# Patient Record
Sex: Female | Born: 1975 | Race: White | Hispanic: No | Marital: Married | Smoking: Former smoker
Health system: Southern US, Community
[De-identification: ages and names within clinical notes are randomized; demographics above are authoritative.]

## PROBLEM LIST (undated history)

## (undated) DIAGNOSIS — C801 Malignant (primary) neoplasm, unspecified: Secondary | ICD-10-CM

## (undated) HISTORY — DX: Malignant (primary) neoplasm, unspecified: C80.1

---

## 1999-04-22 ENCOUNTER — Other Ambulatory Visit: Admission: RE | Admit: 1999-04-22 | Discharge: 1999-04-22 | Payer: Self-pay | Admitting: *Deleted

## 2000-05-18 ENCOUNTER — Other Ambulatory Visit: Admission: RE | Admit: 2000-05-18 | Discharge: 2000-05-18 | Payer: Self-pay | Admitting: Family Medicine

## 2001-06-07 ENCOUNTER — Other Ambulatory Visit: Admission: RE | Admit: 2001-06-07 | Discharge: 2001-06-07 | Payer: Self-pay | Admitting: *Deleted

## 2002-07-27 ENCOUNTER — Other Ambulatory Visit: Admission: RE | Admit: 2002-07-27 | Discharge: 2002-07-27 | Payer: Self-pay | Admitting: Family Medicine

## 2003-10-18 ENCOUNTER — Other Ambulatory Visit: Admission: RE | Admit: 2003-10-18 | Discharge: 2003-10-18 | Payer: Self-pay | Admitting: Family Medicine

## 2004-12-11 ENCOUNTER — Other Ambulatory Visit: Admission: RE | Admit: 2004-12-11 | Discharge: 2004-12-11 | Payer: Self-pay | Admitting: Family Medicine

## 2005-06-18 ENCOUNTER — Emergency Department (HOSPITAL_COMMUNITY): Admission: EM | Admit: 2005-06-18 | Discharge: 2005-06-18 | Payer: Self-pay | Admitting: Emergency Medicine

## 2005-07-08 ENCOUNTER — Ambulatory Visit (HOSPITAL_COMMUNITY): Admission: RE | Admit: 2005-07-08 | Discharge: 2005-07-08 | Payer: Self-pay | Admitting: Family Medicine

## 2005-09-23 ENCOUNTER — Inpatient Hospital Stay (HOSPITAL_COMMUNITY): Admission: AD | Admit: 2005-09-23 | Discharge: 2005-09-24 | Payer: Self-pay | Admitting: *Deleted

## 2005-10-06 ENCOUNTER — Ambulatory Visit (HOSPITAL_COMMUNITY): Admission: RE | Admit: 2005-10-06 | Discharge: 2005-10-06 | Payer: Self-pay | Admitting: *Deleted

## 2005-10-06 ENCOUNTER — Encounter (INDEPENDENT_AMBULATORY_CARE_PROVIDER_SITE_OTHER): Payer: Self-pay | Admitting: *Deleted

## 2005-10-07 DIAGNOSIS — C801 Malignant (primary) neoplasm, unspecified: Secondary | ICD-10-CM

## 2005-10-07 HISTORY — PX: DIAGNOSTIC LAPAROSCOPY: SUR761

## 2005-10-07 HISTORY — DX: Malignant (primary) neoplasm, unspecified: C80.1

## 2005-10-13 ENCOUNTER — Ambulatory Visit (HOSPITAL_COMMUNITY): Admission: RE | Admit: 2005-10-13 | Discharge: 2005-10-13 | Payer: Self-pay | Admitting: *Deleted

## 2005-10-29 ENCOUNTER — Ambulatory Visit: Payer: Self-pay | Admitting: Oncology

## 2005-11-03 ENCOUNTER — Encounter (INDEPENDENT_AMBULATORY_CARE_PROVIDER_SITE_OTHER): Payer: Self-pay | Admitting: *Deleted

## 2005-11-03 ENCOUNTER — Ambulatory Visit (HOSPITAL_COMMUNITY): Admission: RE | Admit: 2005-11-03 | Discharge: 2005-11-04 | Payer: Self-pay | Admitting: *Deleted

## 2005-11-03 HISTORY — PX: ABDOMINAL HYSTERECTOMY: SHX81

## 2005-11-10 ENCOUNTER — Ambulatory Visit: Admission: RE | Admit: 2005-11-10 | Discharge: 2005-11-10 | Payer: Self-pay | Admitting: Gynecologic Oncology

## 2005-11-23 ENCOUNTER — Ambulatory Visit (HOSPITAL_COMMUNITY): Admission: RE | Admit: 2005-11-23 | Discharge: 2005-11-23 | Payer: Self-pay | Admitting: Oncology

## 2005-12-16 ENCOUNTER — Ambulatory Visit: Payer: Self-pay | Admitting: Oncology

## 2005-12-24 LAB — CBC WITH DIFFERENTIAL/PLATELET
Basophils Absolute: 0 10*3/uL (ref 0.0–0.1)
Eosinophils Absolute: 0.1 10*3/uL (ref 0.0–0.5)
HCT: 35.1 % (ref 34.8–46.6)
HGB: 12.1 g/dL (ref 11.6–15.9)
LYMPH%: 44.3 % (ref 14.0–48.0)
MCHC: 34.5 g/dL (ref 32.0–36.0)
MONO#: 0.2 10*3/uL (ref 0.1–0.9)
NEUT#: 2.2 10*3/uL (ref 1.5–6.5)
NEUT%: 48.6 % (ref 39.6–76.8)
Platelets: 132 10*3/uL — ABNORMAL LOW (ref 145–400)
WBC: 4.5 10*3/uL (ref 3.9–10.0)

## 2006-01-05 ENCOUNTER — Ambulatory Visit: Admission: RE | Admit: 2006-01-05 | Discharge: 2006-01-05 | Payer: Self-pay | Admitting: Gynecologic Oncology

## 2006-01-07 LAB — CBC WITH DIFFERENTIAL/PLATELET
BASO%: 1.1 % (ref 0.0–2.0)
Basophils Absolute: 0.1 10*3/uL (ref 0.0–0.1)
EOS%: 2 % (ref 0.0–7.0)
HCT: 37.4 % (ref 34.8–46.6)
LYMPH%: 41.7 % (ref 14.0–48.0)
MCH: 30.4 pg (ref 26.0–34.0)
MCHC: 35.6 g/dL (ref 32.0–36.0)
MCV: 85.2 fL (ref 81.0–101.0)
NEUT%: 46.1 % (ref 39.6–76.8)
Platelets: 159 10*3/uL (ref 145–400)

## 2006-01-14 LAB — CBC WITH DIFFERENTIAL/PLATELET
BASO%: 0.6 % (ref 0.0–2.0)
EOS%: 1.8 % (ref 0.0–7.0)
MCH: 29.7 pg (ref 26.0–34.0)
MCHC: 34.6 g/dL (ref 32.0–36.0)
MCV: 85.9 fL (ref 81.0–101.0)
MONO%: 4.4 % (ref 0.0–13.0)
NEUT%: 50.3 % (ref 39.6–76.8)
RDW: 14.9 % — ABNORMAL HIGH (ref 11.3–14.5)
lymph#: 1.4 10*3/uL (ref 0.9–3.3)

## 2006-01-27 LAB — CBC WITH DIFFERENTIAL/PLATELET
BASO%: 0.7 % (ref 0.0–2.0)
LYMPH%: 47 % (ref 14.0–48.0)
MCHC: 34.8 g/dL (ref 32.0–36.0)
MCV: 87.4 fL (ref 81.0–101.0)
MONO%: 10.7 % (ref 0.0–13.0)
Platelets: 171 10*3/uL (ref 145–400)
RBC: 4.27 10*6/uL (ref 3.70–5.32)

## 2006-02-01 ENCOUNTER — Ambulatory Visit: Payer: Self-pay | Admitting: Oncology

## 2006-02-04 LAB — CBC WITH DIFFERENTIAL/PLATELET
BASO%: 0.4 % (ref 0.0–2.0)
EOS%: 1.2 % (ref 0.0–7.0)
LYMPH%: 53.6 % — ABNORMAL HIGH (ref 14.0–48.0)
MCH: 30.8 pg (ref 26.0–34.0)
MCHC: 35 g/dL (ref 32.0–36.0)
MONO#: 0.2 10*3/uL (ref 0.1–0.9)
Platelets: 112 10*3/uL — ABNORMAL LOW (ref 145–400)
RBC: 3.82 10*6/uL (ref 3.70–5.32)
WBC: 3.1 10*3/uL — ABNORMAL LOW (ref 3.9–10.0)
lymph#: 1.6 10*3/uL (ref 0.9–3.3)

## 2006-02-17 LAB — CBC WITH DIFFERENTIAL/PLATELET
Eosinophils Absolute: 0 10*3/uL (ref 0.0–0.5)
LYMPH%: 42.7 % (ref 14.0–48.0)
MONO#: 0.4 10*3/uL (ref 0.1–0.9)
NEUT#: 1.6 10*3/uL (ref 1.5–6.5)
Platelets: 168 10*3/uL (ref 145–400)
RBC: 3.9 10*6/uL (ref 3.70–5.32)
WBC: 3.6 10*3/uL — ABNORMAL LOW (ref 3.9–10.0)

## 2006-02-25 LAB — CBC WITH DIFFERENTIAL/PLATELET
Basophils Absolute: 0 10*3/uL (ref 0.0–0.1)
EOS%: 1.8 % (ref 0.0–7.0)
Eosinophils Absolute: 0.1 10*3/uL (ref 0.0–0.5)
HGB: 11.6 g/dL (ref 11.6–15.9)
MCH: 31.4 pg (ref 26.0–34.0)
NEUT#: 1.3 10*3/uL — ABNORMAL LOW (ref 1.5–6.5)
RDW: 14.6 % — ABNORMAL HIGH (ref 11.3–14.5)
WBC: 2.9 10*3/uL — ABNORMAL LOW (ref 3.9–10.0)
lymph#: 1.3 10*3/uL (ref 0.9–3.3)

## 2006-03-09 LAB — CBC WITH DIFFERENTIAL/PLATELET
Basophils Absolute: 0 10*3/uL (ref 0.0–0.1)
Eosinophils Absolute: 0 10*3/uL (ref 0.0–0.5)
HGB: 12.4 g/dL (ref 11.6–15.9)
MCV: 90.1 fL (ref 81.0–101.0)
MONO%: 11.9 % (ref 0.0–13.0)
NEUT#: 1.2 10*3/uL — ABNORMAL LOW (ref 1.5–6.5)
RDW: 15.3 % — ABNORMAL HIGH (ref 11.3–14.5)

## 2006-03-15 ENCOUNTER — Ambulatory Visit: Payer: Self-pay | Admitting: Oncology

## 2006-03-18 LAB — CBC WITH DIFFERENTIAL/PLATELET
Basophils Absolute: 0 10*3/uL (ref 0.0–0.1)
Eosinophils Absolute: 0.1 10*3/uL (ref 0.0–0.5)
HCT: 33.5 % — ABNORMAL LOW (ref 34.8–46.6)
HGB: 11.7 g/dL (ref 11.6–15.9)
LYMPH%: 47.9 % (ref 14.0–48.0)
MCV: 90.2 fL (ref 81.0–101.0)
MONO%: 6.2 % (ref 0.0–13.0)
NEUT#: 1.4 10*3/uL — ABNORMAL LOW (ref 1.5–6.5)
NEUT%: 43.7 % (ref 39.6–76.8)
Platelets: 108 10*3/uL — ABNORMAL LOW (ref 145–400)
RBC: 3.71 10*6/uL (ref 3.70–5.32)

## 2006-04-13 ENCOUNTER — Ambulatory Visit (HOSPITAL_COMMUNITY): Admission: RE | Admit: 2006-04-13 | Discharge: 2006-04-13 | Payer: Self-pay | Admitting: Oncology

## 2006-04-20 ENCOUNTER — Ambulatory Visit: Admission: RE | Admit: 2006-04-20 | Discharge: 2006-04-20 | Payer: Self-pay | Admitting: Gynecologic Oncology

## 2006-05-14 ENCOUNTER — Ambulatory Visit (HOSPITAL_COMMUNITY): Admission: RE | Admit: 2006-05-14 | Discharge: 2006-05-14 | Payer: Self-pay | Admitting: Oncology

## 2006-07-14 ENCOUNTER — Ambulatory Visit: Payer: Self-pay | Admitting: Oncology

## 2006-07-23 ENCOUNTER — Ambulatory Visit (HOSPITAL_COMMUNITY): Admission: RE | Admit: 2006-07-23 | Discharge: 2006-07-23 | Payer: Self-pay | Admitting: Oncology

## 2006-07-27 ENCOUNTER — Ambulatory Visit: Admission: RE | Admit: 2006-07-27 | Discharge: 2006-07-27 | Payer: Self-pay | Admitting: Gynecologic Oncology

## 2006-11-04 ENCOUNTER — Ambulatory Visit: Payer: Self-pay | Admitting: Oncology

## 2006-11-09 LAB — COMPREHENSIVE METABOLIC PANEL
Albumin: 4.6 g/dL (ref 3.5–5.2)
Alkaline Phosphatase: 78 U/L (ref 39–117)
BUN: 15 mg/dL (ref 6–23)
CO2: 29 mEq/L (ref 19–32)
Glucose, Bld: 97 mg/dL (ref 70–99)
Sodium: 142 mEq/L (ref 135–145)
Total Bilirubin: 0.3 mg/dL (ref 0.3–1.2)
Total Protein: 7.2 g/dL (ref 6.0–8.3)

## 2006-11-09 LAB — CBC WITH DIFFERENTIAL/PLATELET
BASO%: 0.4 % (ref 0.0–2.0)
Basophils Absolute: 0 10*3/uL (ref 0.0–0.1)
EOS%: 1.5 % (ref 0.0–7.0)
HGB: 13.8 g/dL (ref 11.6–15.9)
MCH: 29.3 pg (ref 26.0–34.0)
MCV: 84.4 fL (ref 81.0–101.0)
MONO%: 6.8 % (ref 0.0–13.0)
RBC: 4.73 10*6/uL (ref 3.70–5.32)
RDW: 12.7 % (ref 11.3–14.5)
lymph#: 2.1 10*3/uL (ref 0.9–3.3)

## 2006-11-09 LAB — CA 125: CA 125: 4.8 U/mL (ref 0.0–30.2)

## 2007-01-31 ENCOUNTER — Emergency Department (HOSPITAL_COMMUNITY): Admission: EM | Admit: 2007-01-31 | Discharge: 2007-01-31 | Payer: Self-pay | Admitting: Emergency Medicine

## 2007-03-03 ENCOUNTER — Ambulatory Visit: Payer: Self-pay | Admitting: Oncology

## 2007-03-07 LAB — CA 125: CA 125: 4.8 U/mL (ref 0.0–30.2)

## 2007-03-08 ENCOUNTER — Ambulatory Visit: Admission: RE | Admit: 2007-03-08 | Discharge: 2007-03-08 | Payer: Self-pay | Admitting: Gynecologic Oncology

## 2007-05-06 ENCOUNTER — Ambulatory Visit: Payer: Self-pay | Admitting: Oncology

## 2007-05-10 LAB — CBC WITH DIFFERENTIAL/PLATELET
BASO%: 0.4 % (ref 0.0–2.0)
EOS%: 1.9 % (ref 0.0–7.0)
HCT: 37.7 % (ref 34.8–46.6)
HGB: 13 g/dL (ref 11.6–15.9)
MCH: 29.1 pg (ref 26.0–34.0)
MCHC: 34.6 g/dL (ref 32.0–36.0)
MONO#: 0.4 10*3/uL (ref 0.1–0.9)
RDW: 12.9 % (ref 11.3–14.5)
WBC: 5.6 10*3/uL (ref 3.9–10.0)
lymph#: 1.8 10*3/uL (ref 0.9–3.3)

## 2007-05-11 LAB — COMPREHENSIVE METABOLIC PANEL
ALT: 19 U/L (ref 0–35)
AST: 14 U/L (ref 0–37)
Albumin: 4.5 g/dL (ref 3.5–5.2)
CO2: 29 mEq/L (ref 19–32)
Calcium: 9.6 mg/dL (ref 8.4–10.5)
Chloride: 105 mEq/L (ref 96–112)
Potassium: 4.2 mEq/L (ref 3.5–5.3)
Sodium: 143 mEq/L (ref 135–145)
Total Protein: 6.7 g/dL (ref 6.0–8.3)

## 2007-05-11 LAB — CA 125: CA 125: 5.4 U/mL (ref 0.0–30.2)

## 2007-09-06 ENCOUNTER — Ambulatory Visit: Payer: Self-pay | Admitting: Oncology

## 2007-09-13 ENCOUNTER — Ambulatory Visit: Admission: RE | Admit: 2007-09-13 | Discharge: 2007-09-13 | Payer: Self-pay | Admitting: Gynecologic Oncology

## 2007-09-28 ENCOUNTER — Ambulatory Visit (HOSPITAL_COMMUNITY): Admission: RE | Admit: 2007-09-28 | Discharge: 2007-09-28 | Payer: Self-pay | Admitting: Gynecologic Oncology

## 2007-12-08 ENCOUNTER — Ambulatory Visit: Payer: Self-pay | Admitting: Oncology

## 2007-12-08 LAB — CBC WITH DIFFERENTIAL/PLATELET
BASO%: 0.5 % (ref 0.0–2.0)
LYMPH%: 37.3 % (ref 14.0–48.0)
MCHC: 34.7 g/dL (ref 32.0–36.0)
MONO#: 0.4 10*3/uL (ref 0.1–0.9)
Platelets: 208 10*3/uL (ref 145–400)
RBC: 4.41 10*6/uL (ref 3.70–5.32)
WBC: 6.1 10*3/uL (ref 3.9–10.0)

## 2007-12-08 LAB — COMPREHENSIVE METABOLIC PANEL
ALT: 18 U/L (ref 0–35)
Alkaline Phosphatase: 86 U/L (ref 39–117)
CO2: 29 mEq/L (ref 19–32)
Sodium: 142 mEq/L (ref 135–145)
Total Bilirubin: 0.2 mg/dL — ABNORMAL LOW (ref 0.3–1.2)
Total Protein: 6.9 g/dL (ref 6.0–8.3)

## 2007-12-08 LAB — CA 125: CA 125: 2.5 U/mL (ref 0.0–30.2)

## 2008-02-05 ENCOUNTER — Emergency Department (HOSPITAL_COMMUNITY): Admission: EM | Admit: 2008-02-05 | Discharge: 2008-02-05 | Payer: Self-pay | Admitting: Emergency Medicine

## 2008-02-28 ENCOUNTER — Ambulatory Visit: Payer: Self-pay | Admitting: Oncology

## 2008-03-01 LAB — CBC WITH DIFFERENTIAL/PLATELET
Eosinophils Absolute: 0.2 10*3/uL (ref 0.0–0.5)
HGB: 13.3 g/dL (ref 11.6–15.9)
MONO#: 0.5 10*3/uL (ref 0.1–0.9)
NEUT#: 3.7 10*3/uL (ref 1.5–6.5)
RBC: 4.6 10*6/uL (ref 3.70–5.32)
RDW: 12.5 % (ref 11.3–14.5)
WBC: 6.8 10*3/uL (ref 3.9–10.0)

## 2008-03-01 LAB — COMPREHENSIVE METABOLIC PANEL
Albumin: 4.5 g/dL (ref 3.5–5.2)
Alkaline Phosphatase: 84 U/L (ref 39–117)
CO2: 26 mEq/L (ref 19–32)
Calcium: 9.1 mg/dL (ref 8.4–10.5)
Chloride: 106 mEq/L (ref 96–112)
Glucose, Bld: 93 mg/dL (ref 70–99)
Potassium: 3.9 mEq/L (ref 3.5–5.3)
Sodium: 142 mEq/L (ref 135–145)
Total Protein: 6.9 g/dL (ref 6.0–8.3)

## 2008-03-01 LAB — CA 125: CA 125: 4.7 U/mL (ref 0.0–30.2)

## 2008-03-06 ENCOUNTER — Ambulatory Visit: Admission: RE | Admit: 2008-03-06 | Discharge: 2008-03-06 | Payer: Self-pay | Admitting: Gynecologic Oncology

## 2008-05-18 ENCOUNTER — Ambulatory Visit: Payer: Self-pay | Admitting: Oncology

## 2008-06-26 LAB — CBC WITH DIFFERENTIAL/PLATELET
BASO%: 0.3 % (ref 0.0–2.0)
EOS%: 1.5 % (ref 0.0–7.0)
HCT: 39.5 % (ref 34.8–46.6)
LYMPH%: 26.3 % (ref 14.0–48.0)
MCH: 28.6 pg (ref 26.0–34.0)
MCHC: 34 g/dL (ref 32.0–36.0)
NEUT%: 65.6 % (ref 39.6–76.8)
Platelets: 222 10*3/uL (ref 145–400)

## 2008-06-26 LAB — COMPREHENSIVE METABOLIC PANEL
ALT: 17 U/L (ref 0–35)
AST: 13 U/L (ref 0–37)
CO2: 29 mEq/L (ref 19–32)
Creatinine, Ser: 0.71 mg/dL (ref 0.40–1.20)
Total Bilirubin: 0.3 mg/dL (ref 0.3–1.2)

## 2008-06-26 LAB — CA 125: CA 125: 3.6 U/mL (ref 0.0–30.2)

## 2008-07-03 ENCOUNTER — Ambulatory Visit: Payer: Self-pay | Admitting: Oncology

## 2008-10-19 ENCOUNTER — Ambulatory Visit: Payer: Self-pay | Admitting: Oncology

## 2008-10-30 ENCOUNTER — Ambulatory Visit: Admission: RE | Admit: 2008-10-30 | Discharge: 2008-10-30 | Payer: Self-pay | Admitting: Gynecologic Oncology

## 2009-02-28 ENCOUNTER — Ambulatory Visit: Payer: Self-pay | Admitting: Oncology

## 2009-03-04 LAB — COMPREHENSIVE METABOLIC PANEL
ALT: 20 U/L (ref 0–35)
AST: 18 U/L (ref 0–37)
Albumin: 4 g/dL (ref 3.5–5.2)
Calcium: 9 mg/dL (ref 8.4–10.5)
Chloride: 103 mEq/L (ref 96–112)
Potassium: 4 mEq/L (ref 3.5–5.3)

## 2009-03-04 LAB — CBC WITH DIFFERENTIAL/PLATELET
BASO%: 0.3 % (ref 0.0–2.0)
Basophils Absolute: 0 10*3/uL (ref 0.0–0.1)
EOS%: 2.7 % (ref 0.0–7.0)
HGB: 13.8 g/dL (ref 11.6–15.9)
MCH: 29.3 pg (ref 25.1–34.0)
MCHC: 34.9 g/dL (ref 31.5–36.0)
RDW: 13.3 % (ref 11.2–14.5)
lymph#: 2.1 10*3/uL (ref 0.9–3.3)

## 2009-09-06 ENCOUNTER — Ambulatory Visit: Payer: Self-pay | Admitting: Oncology

## 2009-09-11 LAB — VITAMIN D 25 HYDROXY (VIT D DEFICIENCY, FRACTURES): Vit D, 25-Hydroxy: 21 ng/mL — ABNORMAL LOW (ref 30–89)

## 2009-09-11 LAB — CA 125: CA 125: 5.1 U/mL (ref 0.0–30.2)

## 2009-09-18 ENCOUNTER — Other Ambulatory Visit: Admission: RE | Admit: 2009-09-18 | Discharge: 2009-09-18 | Payer: Self-pay | Admitting: Gynecologic Oncology

## 2009-09-18 ENCOUNTER — Ambulatory Visit: Admission: RE | Admit: 2009-09-18 | Discharge: 2009-09-18 | Payer: Self-pay | Admitting: Gynecologic Oncology

## 2009-09-20 ENCOUNTER — Emergency Department (HOSPITAL_COMMUNITY): Admission: EM | Admit: 2009-09-20 | Discharge: 2009-09-20 | Payer: Self-pay | Admitting: Emergency Medicine

## 2010-02-21 ENCOUNTER — Ambulatory Visit: Payer: Self-pay | Admitting: Oncology

## 2010-02-25 LAB — CBC WITH DIFFERENTIAL/PLATELET
BASO%: 0.5 % (ref 0.0–2.0)
Basophils Absolute: 0 10*3/uL (ref 0.0–0.1)
EOS%: 3.4 % (ref 0.0–7.0)
Eosinophils Absolute: 0.2 10*3/uL (ref 0.0–0.5)
HCT: 37.2 % (ref 34.8–46.6)
HGB: 13 g/dL (ref 11.6–15.9)
LYMPH%: 35 % (ref 14.0–49.7)
MCH: 29.3 pg (ref 25.1–34.0)
MCHC: 35 g/dL (ref 31.5–36.0)
MCV: 83.7 fL (ref 79.5–101.0)
MONO#: 0.4 10*3/uL (ref 0.1–0.9)
MONO%: 7.2 % (ref 0.0–14.0)
NEUT#: 3.2 10*3/uL (ref 1.5–6.5)
NEUT%: 53.9 % (ref 38.4–76.8)
Platelets: 208 10*3/uL (ref 145–400)
RBC: 4.44 10*6/uL (ref 3.70–5.45)
RDW: 13.4 % (ref 11.2–14.5)
WBC: 5.9 10*3/uL (ref 3.9–10.3)
lymph#: 2 10*3/uL (ref 0.9–3.3)

## 2010-02-25 LAB — COMPREHENSIVE METABOLIC PANEL
ALT: 15 U/L (ref 0–35)
AST: 13 U/L (ref 0–37)
Albumin: 4.5 g/dL (ref 3.5–5.2)
Alkaline Phosphatase: 80 U/L (ref 39–117)
BUN: 11 mg/dL (ref 6–23)
CO2: 27 mEq/L (ref 19–32)
Calcium: 9.5 mg/dL (ref 8.4–10.5)
Chloride: 105 mEq/L (ref 96–112)
Creatinine, Ser: 0.67 mg/dL (ref 0.40–1.20)
Glucose, Bld: 97 mg/dL (ref 70–99)
Potassium: 4.5 mEq/L (ref 3.5–5.3)
Sodium: 143 mEq/L (ref 135–145)
Total Bilirubin: 0.3 mg/dL (ref 0.3–1.2)
Total Protein: 6.8 g/dL (ref 6.0–8.3)

## 2010-02-25 LAB — CA 125: CA 125: 2.9 U/mL (ref 0.0–30.2)

## 2010-06-11 IMAGING — CT CT ABDOMEN W/ CM
2 of 5 series · 16 of 46 positions shown, 18 images · IV contrast (agent unspecified)
Comparison: CT dated 04/13/2006

CT ABDOMEN

CLINICAL DATA: 33-year-old with right lower back pain.

CT ABDOMEN AND PELVIS WITH CONTRAST
TECHNIQUE: Multidetector CT imaging of the abdomen and pelvis was
performed using the standard protocol following bolus
administration of intravenous contrast.
Contrast: 100 ml 4mnipaque-3BB

[Series 2: abd_pel_with 5.0 b40f · axial · 0.69mm/px · z∈[-496,-76]mm · 13 of 100 slices shown, 15 images]
[im 8/100  soft-tissue]
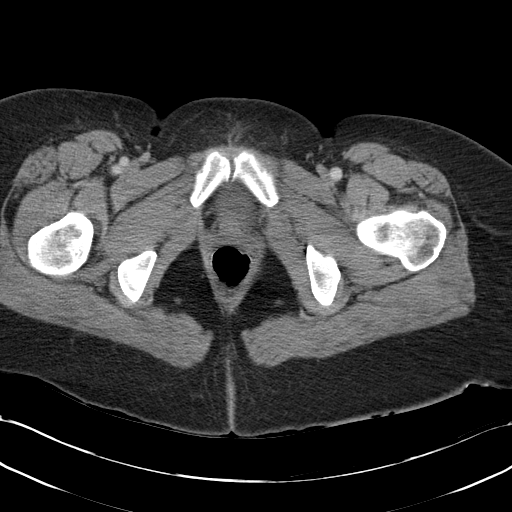
[im 8/100  bone]
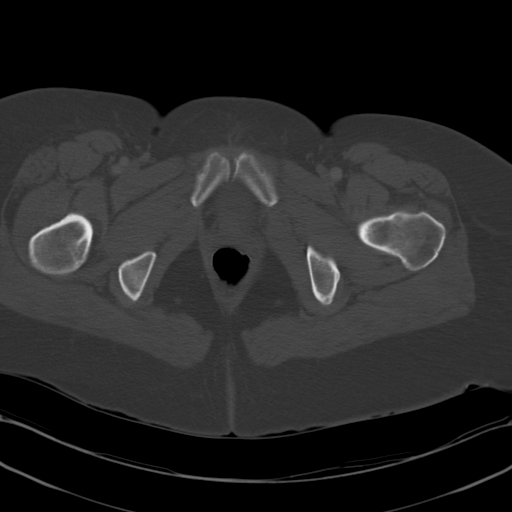
[im 15/100  soft-tissue]
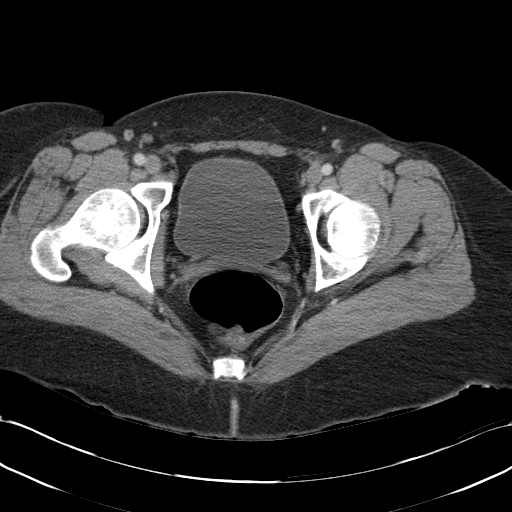
[im 22/100  soft-tissue]
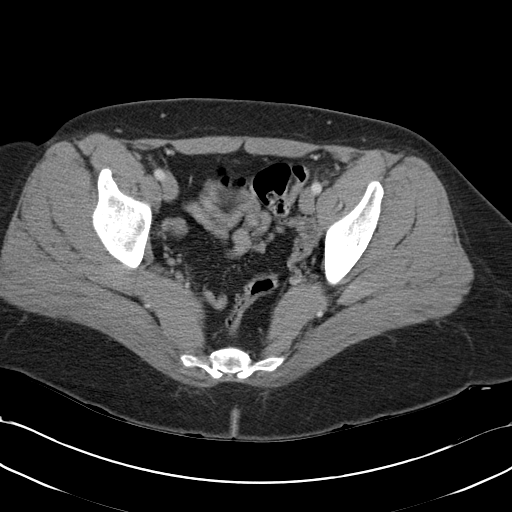
[im 29/100  soft-tissue]
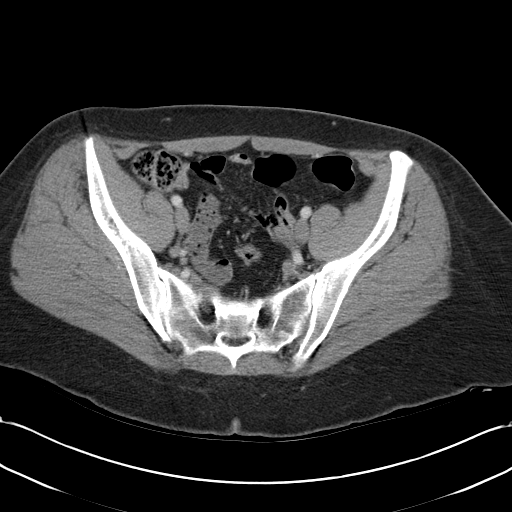
[im 36/100  soft-tissue]
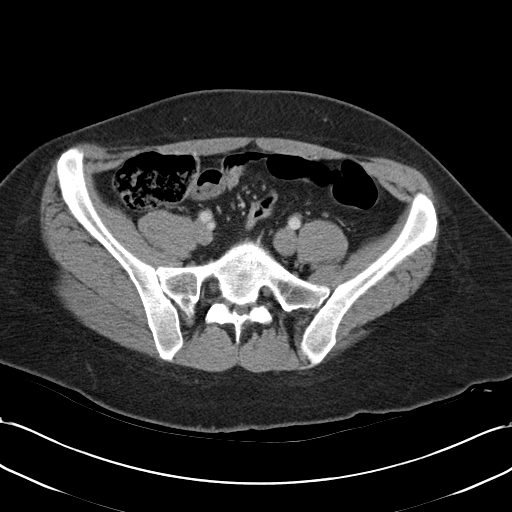
[im 43/100  soft-tissue]
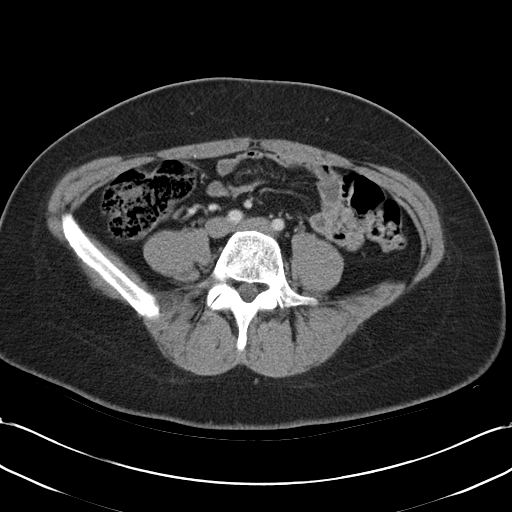
[im 50/100  soft-tissue]
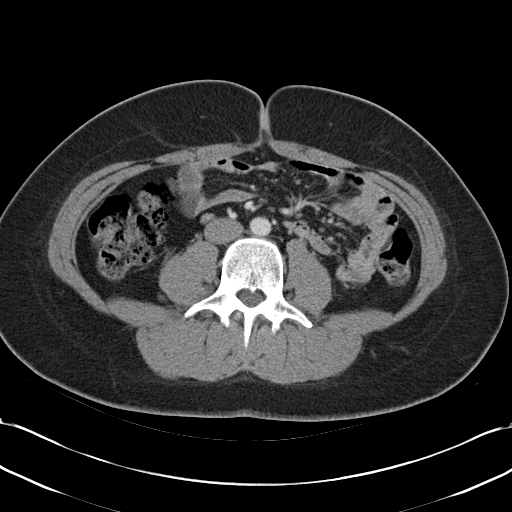
[im 57/100  soft-tissue]
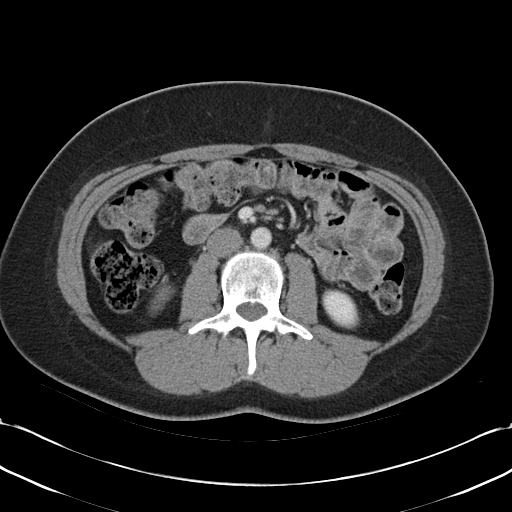
[im 64/100  soft-tissue]
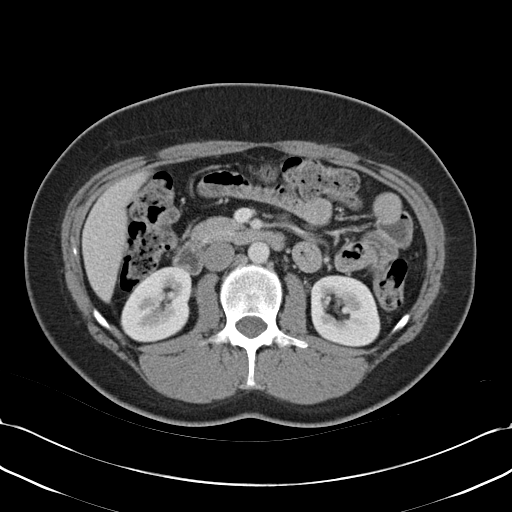
[im 64/100  bone]
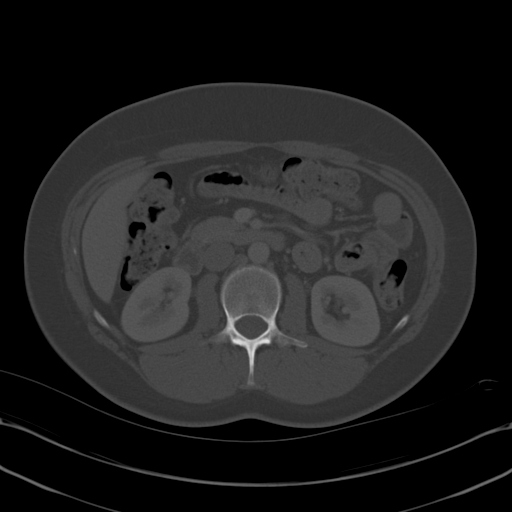
[im 71/100  soft-tissue]
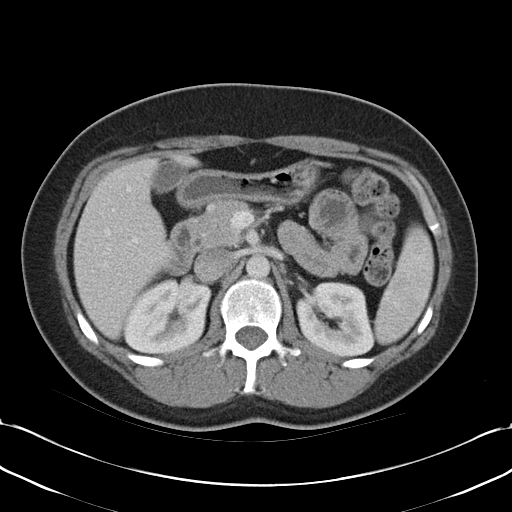
[im 78/100  soft-tissue]
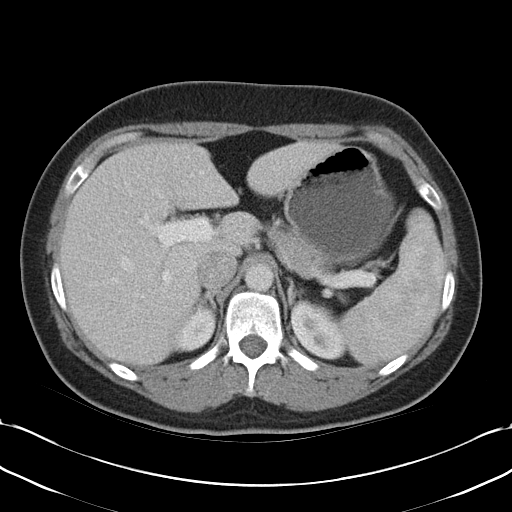
[im 85/100  soft-tissue]
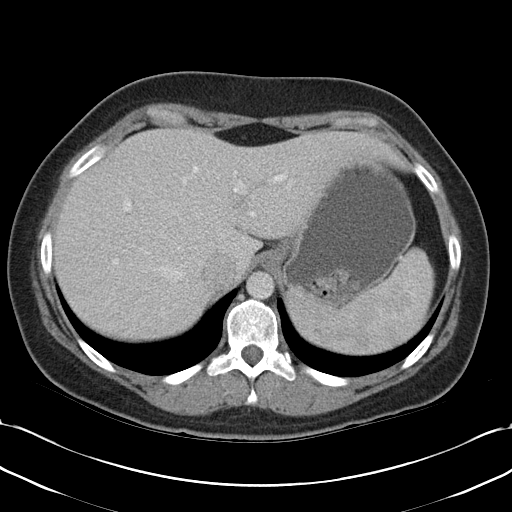
[im 92/100  soft-tissue]
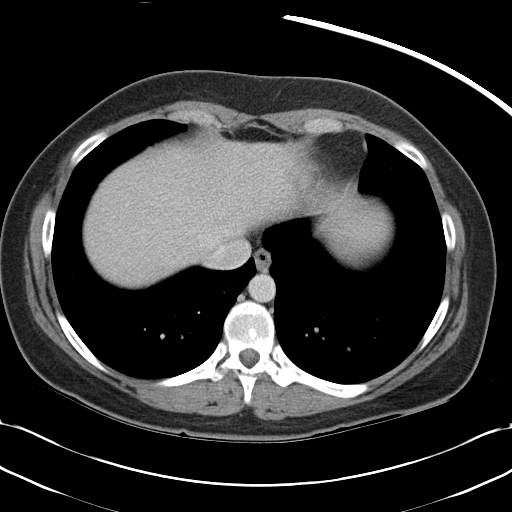

[Series 4: mpr cor post contrast (id) · coronal · 0.63mm/px · 3 of 63 slices shown]
[im 21/63  soft-tissue]
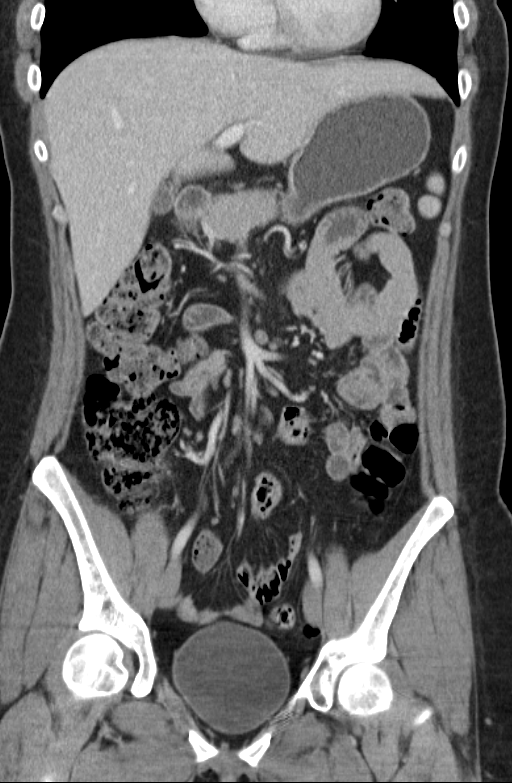
[im 28/63  soft-tissue]
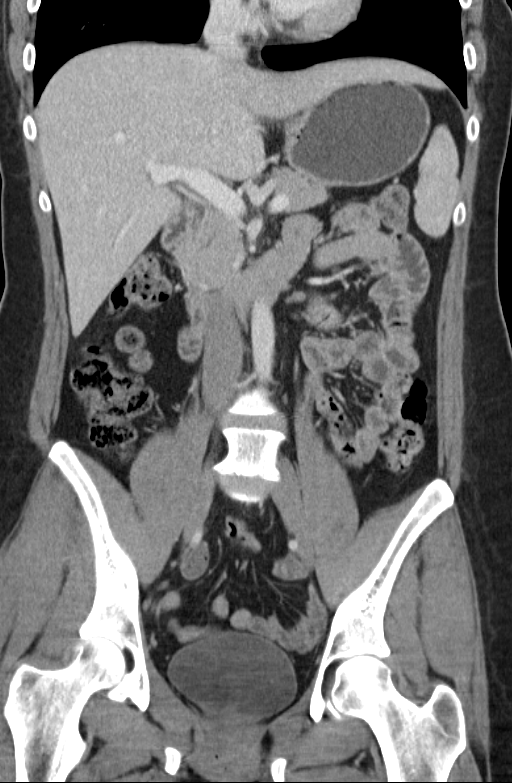
[im 35/63  soft-tissue]
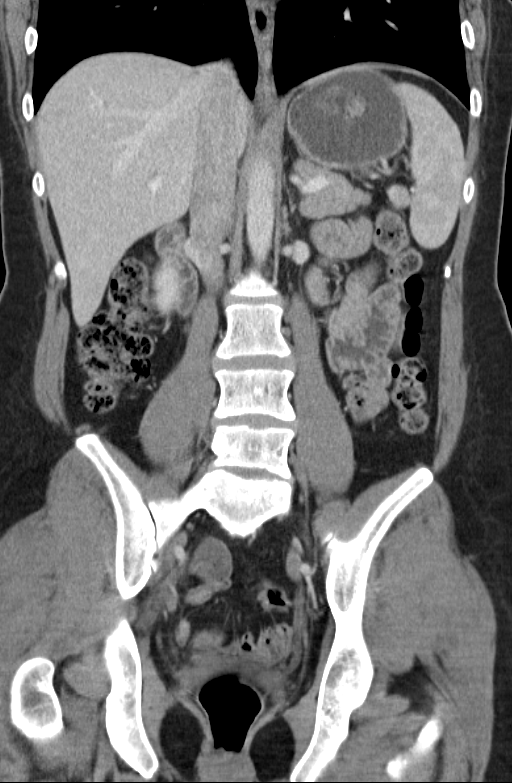

[16 of 46 positions shown; findings below may reference images not displayed]

FINDINGS: The lung bases are clear.  There is no evidence to
suggest free air.  There is a normal appearance of the liver,
portal venous system, gallbladder, spleen, adrenal glands, pancreas
and kidneys.  No evidence for free fluid or lymphadenopathy in the
abdomen.  Appendix is not confidently identified but there are no
inflammatory changes in the right lower quadrant.  Stable area of
sclerosis involving the right iliac bone.  No acute bony
abnormalities.
IMPRESSION: No acute abdominal findings.

CT PELVIS
FINDINGS: The uterus has been removed.  The left adnexa tissue is
not well visualized compared to prior examination.  The cystic
structure along the left hemi pelvis has resolved.   Fluid in the
urinary bladder.  No significant free fluid or lymphadenopathy in
the pelvis.  No acute bony abnormalities.
IMPRESSION: No acute pelvic findings.

## 2010-09-02 ENCOUNTER — Ambulatory Visit: Payer: Self-pay | Admitting: Oncology

## 2010-09-04 LAB — CA 125: CA 125: 2.2 U/mL (ref 0.0–30.2)

## 2010-09-18 ENCOUNTER — Ambulatory Visit
Admission: RE | Admit: 2010-09-18 | Discharge: 2010-09-18 | Payer: Self-pay | Source: Home / Self Care | Attending: Gynecologic Oncology | Admitting: Gynecologic Oncology

## 2010-10-12 ENCOUNTER — Encounter: Payer: Self-pay | Admitting: Emergency Medicine

## 2010-12-22 LAB — URINALYSIS, ROUTINE W REFLEX MICROSCOPIC
Bilirubin Urine: NEGATIVE
Glucose, UA: NEGATIVE mg/dL
Hgb urine dipstick: NEGATIVE
Ketones, ur: NEGATIVE mg/dL
Nitrite: NEGATIVE
Protein, ur: NEGATIVE mg/dL
Specific Gravity, Urine: 1.01 (ref 1.005–1.030)
Urobilinogen, UA: 0.2 mg/dL (ref 0.0–1.0)
pH: 7 (ref 5.0–8.0)

## 2010-12-22 LAB — DIFFERENTIAL
Eosinophils Relative: 2 % (ref 0–5)
Lymphocytes Relative: 36 % (ref 12–46)
Monocytes Absolute: 0.3 10*3/uL (ref 0.1–1.0)
Monocytes Relative: 5 % (ref 3–12)
Neutro Abs: 3.2 10*3/uL (ref 1.7–7.7)

## 2010-12-22 LAB — BASIC METABOLIC PANEL
CO2: 29 mEq/L (ref 19–32)
Calcium: 9.7 mg/dL (ref 8.4–10.5)
GFR calc Af Amer: >60 mL/min (ref >60)
GFR calc non Af Amer: >60 mL/min (ref >60)
Potassium: 4 mEq/L (ref 3.5–5.1)
Sodium: 139 mEq/L (ref 135–145)

## 2010-12-22 LAB — CBC
HCT: 40.9 % (ref 36.0–46.0)
Hemoglobin: 13.9 g/dL (ref 12.0–15.0)
RBC: 4.8 MIL/uL (ref 3.87–5.11)

## 2011-02-03 NOTE — Consult Note (Signed)
NAMESOPHEAP, BASIC                ACCOUNT NO.:  [id number]   MEDICAL RECORD NO.:  [id number]          PATIENT TYPE:  OUT   LOCATION:  GYN                          FACILITY:  [facility]   PHYSICIAN:  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  Pathology was consistent with stage Ic disease based on  positive washings.  After extensive counseling, she opted for  laparoscopic hysterectomy, LSO and appropriate staging which she  underwent in February 2007.  She did well postoperatively.  All lymph  node biopsies and adhesions were negative with the exception of 0.1-cm  focus of metastatic carcinoma involving the surface adhesions of the  left ovary.  She underwent six cycles of Taxol and carboplatin, which  she completed in July 2007.  She had a post-treatment CT scan which  revealed a lymphocyst.  We last saw her in November 2007.  She was seen  by Dr. Darnelle Catalan in March 2008.  Recent CA-125 yesterday was 4.8.  She  has scheduled follow up with Dr. Darnelle Catalan in August 2008.  She overall  is doing quite well.  The biggest issue she complains about is some  motor weakness on her right foot.  She states she is unable to move her  toes on her right foot.  She does have family history significant for  Charcot-Marie-Tooth with significant debilitation in her father and  uncle.  She states that her sister and grandmother also have  manifestations of Charcot-Marie-Tooth although not as severe as her  father and uncle.  She is aware that women tend to be carriers and the  men tend to have the manifestations, but in her family the females seem  to have some manifestations.  Her sister, who is younger, and her  grandmother have had to have surgical  correction for foot deformities  felt to be related.  She otherwise denies any significant neuropathy.  She denies abdominal pain, fevers, chills, headaches, visual changes,  early satiety, nausea, or vomiting.   PHYSICAL EXAMINATION:  VITAL SIGNS:  Weight 194 pounds.  GENERAL:  This is a well-nourished and well-developed female in no acute  distress.  NECK:  Neck is supple.  No lymphadenopathy, no thyromegaly.  LUNGS:  Lungs are clear to auscultation bilaterally.  CARDIOVASCULAR:  Regular rate and rhythm.  ABDOMEN:  She has well-healed laparoscopy skin incisions.  Abdomen is  soft, nontender, nondistended.  There are no palpable masses or  hepatosplenomegaly.  Groins are negative for adenopathy.  EXTREMITIES:  There is no edema.  NEUROLOGICAL:  She is unable to abduct the toes of her right foot  compared to her left.  She has good ankle strength and movement.  PELVIC:  Bimanual examination reveals no masses or nodularity.  RECTAL:  Rectal confirms.   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  She has no clinical evidence of recurrent  disease.   PLAN:  1. She will return to see Dr. Darnelle Catalan for her followup in August 2008      and will return to see [place] in 5 to 6 months.  2. She may have some manifestations of Charcot-Marie-Tooth.  I think      it would be reasonable for her to see a neurologist. She will be      given information regarding this.  If she has any change in      symptomatology, she knows to alert [place] to that fact prior to her      next visit.      [name], MD  Electronically Signed     PAG/MEDQ  D:  03/08/2007  T:  03/09/2007  Job:  [id number]   cc:   [name], M.D.  Fax: [phone]   [name], R.N.  [address]  [place], [place] [address]

## 2011-02-03 NOTE — Consult Note (Signed)
[name], Duke                ACCOUNT NO.:  [id number]   MEDICAL RECORD NO.:  [id number]          PATIENT TYPE:  OUT   LOCATION:  GYN                          FACILITY:  [facility]   PHYSICIAN:  [name], MD    DATE OF BIRTH:  Jan 25, 1976   DATE OF CONSULTATION:  03/06/2008  DATE OF DISCHARGE:                                 CONSULTATION   [name] is a very pleasant 35 year old gravida 0 with a stage Ic  endometrioid and clear cell ovarian carcinoma diagnosed in January 2007.  Pathology was consistent with stage Ic disease based on positive  washings.  After extensive counseling, she underwent 6 cycles  of Taxol  and carboplatin, which she completed in July 2007.  Post-treatment CT  scan revealed a lymphocyst but was otherwise negative.  I last saw her  in December 2008, at which time her exam was unremarkable.  She did have  a bone density study at that time that revealed a normal bone density.  She did have a T score of -2 in her spine, -1 to -2 in her left and  right femurs respectively.  She was seen by Dr. Darnelle Catalan in March 2009  with a negative examination.  The CA-125 and blood work on June 11 was  negative with a CA-125 at 4.7.  She is otherwise doing quite well.  She  has complained of some occasional groin pain that has been going on for  greater than a month.  She is not sure if it might not be a gas pain.  There is no pattern to it.  It does not wake her up at night.  It lasts  about 3 to 4 seconds and has not gotten worse over that time.  She has  been thinking of factors that might aggravate or alleviate her symptoms,  and she cannot.  Similarly she has what she describes as a poking  feeling under her arms bilaterally that has been going on for about a  month, similar in terms of the left lower quadrant pain, and there is no  pattern, it does not wake her up at night.  It lasts only a few seconds,  but she felt that it was important that we be aware of that.  She  is  otherwise doing quite well and denies any change in bowel or bladder  habits, any dyspareunia, any nausea, vomiting, fevers, chills,  unintentional weight loss or weight gain.  She is very pleased that her  husband has quit smoking since October 2008.  They are celebrating their  14th anniversary together.   PHYSICAL EXAMINATION:  VITAL SIGNS:  Weight 206 pounds, which is down 2  pounds since December.  Blood pressure 106/65.  GENERAL:  Well-nourished, well-developed female in no acute distress.  NECK:  Supple.  There is no lymphadenopathy, no thyromegaly.  LUNGS:  Clear to auscultation bilaterally.  CARDIOVASCULAR:  Regular rate and rhythm.  AXILLARY:  Evaluation reveals no lymphadenopathy bilaterally.  ABDOMEN:  Shows well-healed surgical incisions.  The abdomen is soft,  nontender, nondistended.  There are no  palpable masses or  hepatosplenomegaly.  Groins are negative for adenopathy.  EXTREMITIES:  There is no edema.  PELVIC:  External genitalia is within normal limits.  Bimanual  examination reveals no masses or nodularity.  Rectal confirms.   ASSESSMENT:  A 35 year old with history of stage Ic clear cell ovarian  carcinoma who has no evidence of recurrent disease.   PLAN:  1. She will continue her calcium.  She has restarted a weightbearing      exercise program at [employer].  She was encouraged to do this.  Even      though her bone density study is normal, it is at the lower range      of normal and we      will repeat a bone density study in 2 years.  2. She will return to see Dr. Darnelle Catalan in 4 months and return to see      [place] in 8 months when she is at 2 years out from her diagnosis.      [name], MD  Electronically Signed     PAG/MEDQ  D:  03/06/2008  T:  03/06/2008  Job:  [id number]   cc:   [name], M.D.  Fax: [phone]   [name], R.N.  [address]  [place], [place] [address]   [name]. [name], M.D.  Fax: [phone]

## 2011-02-03 NOTE — Consult Note (Signed)
[name], Duke                ACCOUNT NO.:  [id number]   MEDICAL RECORD NO.:  [id number]          PATIENT TYPE:  OUT   LOCATION:  GYN                          FACILITY:  [facility]   PHYSICIAN:  [name], MD    DATE OF BIRTH:  11-01-1975   DATE OF CONSULTATION:  10/30/2008  DATE OF DISCHARGE:                                 CONSULTATION   [name] is a very pleasant 35 year old gravida 0 with a history of a stage  I C endometrioid and clear cell ovarian carcinoma diagnosed in January  of 2007.  Pathology was consistent with stage I C disease based on  positive washings.  After extensive counseling, she underwent 6 cycles  of paclitaxel and carboplatin, which she completed in July of 2007.  Post-treatment CT scan revealed a lymphocyst that was otherwise  negative.  CA-125's have also been completely normal.  Her most recent  one last Tuesday was 3.5.  She was seen by Dr. Darrall Dears practice in  October of 2009 with a negative examination, and they dispositioned her  to close follow up at that time.  They also started her on Wellbutrin  150 mg per day.  She comes in today and is overall doing quite well.  She does state that the Wellbutrin has helped significantly.  It has  also helped with her night sweats and evening hot flashes.  She has also  started exercising since the holidays doing Wii and Wii Fit and is  feeling much better with regards to her energy level.  She also believes  that the exercise has helped diminish her hot flashes.   REVIEW OF SYSTEMS:  She denies any chest pain, short of breath, nausea,  vomiting, fevers, chills, abdominal bloating, abdominal pain,  significant change in her bowel or bladder habits.   PHYSICAL EXAMINATION:  VITAL SIGNS:  Weight 211 pounds, which is up 5  pounds since we saw her in June, blood pressure 123/84.  GENERAL:  Well-nourished, well-developed female in no acute distress.  NECK:  Supple.  There is no lymphadenopathy, no  thyromegaly.  LUNGS:  Clear to auscultation bilaterally.  CARDIOVASCULAR:  Regular rate and rhythm.  ABDOMEN:  Notable for healed surgical incisions.  Abdomen is soft,  nontender, nondistended.  There is no fluid wave.  There are no palpable  masses or hepatosplenomegaly.  Groins are negative for adenopathy.  EXTREMITIES:  There is no edema.  PELVIC:  External genitalia is within normal limits.  Bimanual  examination reveals no masses or nodularity at the top of the vaginal  cuff.  RECTAL:  Rectal exam confirms.   ASSESSMENT:  A 35 year old with a stage I C ovarian carcinoma who  clinically has no evidence of recurrent disease.  Her CA-125 is also  normal.   PLAN:  1. She will continue her Wellbutrin as prescribed by Dr. Darrall Dears      office.  She will follow up with them for her      regularly scheduled appointment in June of 2010 and will see her in      December of 2010.  At that time, will perform a Pap smear.  If her      Pap smear is normal, she can then have every q. 3-year surveillance      with Pap smears.      [name], MD  Electronically Signed     PAG/MEDQ  D:  10/30/2008  T:  10/30/2008  Job:  [id number]   cc:   [facility]  [facility] C. Magrinat, M.D.  Fax: [phone]   [name], R.N.  [address]  [place], [place] [address]

## 2011-02-03 NOTE — Consult Note (Signed)
NAMEJONIE, BURDELL                ACCOUNT NO.:  [id number]   MEDICAL RECORD NO.:  [id number]          PATIENT TYPE:  OUT   LOCATION:  GYN                          FACILITY:  [facility]   PHYSICIAN:  [name], MD    DATE OF BIRTH:  [birth year]   DATE OF CONSULTATION:  09/13/2007  DATE OF DISCHARGE:                                 CONSULTATION   [name] is a very pleasant 35 year old gravida 0 with a stage IC  endometrioid and clear cell ovarian carcinoma diagnosed in January 2007.  Pathology was consistent with stage IC disease based on positive  washings.  After extensive counseling she underwent six cycles of Taxol  and carboplatin, which she completed in July 2007.  She had post-  treatment CT scan, which revealed a lymphocyst but was otherwise  negative.  I last saw her in June 2008, at which time her exam was  unremarkable.  She has also been seen by Dr. Darnelle Catalan.  CA-125 most  recently December 18 was normal at 4.8.  Prior CA-125's have also been  normal at 5.4 and 4.8 in June 2008.  When I last saw her she was having  significant issues with her feet.  She did give a family history of  Charcot-Marie-Tooth in her sister, grandmother, who had some  manifestations but was not severe as that of her father and uncle.  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  With regards to GYN oncology-type issues, she is overall doing quite  well.  She was started on Effexor by Dr. Darnelle Catalan for her hot flashes  and that has helped significantly.  She has stopped her estrogen patch  but is suffering from some vaginal dryness, and she would be interested  in more regional estrogen therapy.  In addition, she would like to have  a bone density study.  She is walking about a mile per day but is  looking forward to getting in a more regular exercise program.  There  has been increased stress in the  family as her sister-in-law is  currently in the hospital today having surgery for a worrisome ovarian  mass.  Lakyra herself denies any vaginal bleeding, any abdominal  bloating, any early satiety, any change in bowel or bladder habits or  pelvic or abdominal pain.   PHYSICAL EXAMINATION:  Weight 208 pounds, which is up 14 pounds since  June, blood pressure 112/74.  Well-nourished, well-developed female in no acute distress.  NECK:  Supple.  There is no lymphadenopathy, no thyromegaly.  LUNGS:  Clear to auscultation bilaterally.  CARDIOVASCULAR:  Regular rate and rhythm.  ABDOMEN:  She has well-healed surgical incisions.  Abdomen is soft,  nontender, nondistended.  There are no palpable masses or  hepatosplenomegaly.  Groins are negative for adenopathy.  EXTREMITIES:  There is no edema.  PELVIC:  External genitalia is within normal limits.  Bimanual  examination reveals no masses or nodularity.  Rectal confirms.   A pleasant 35 year old with a history of a  stage IC clear cell and  endometrioid adenocarcinoma of the ovary, who has no evidence of  recurrent disease.   PLAN:  1. She will continue taking her Effexor as it is helping her symptoms      significantly and I think it is a very reasonable idea.  We will      start her on Vagifem vaginal tablets three times a week.  She was      given a prescription and directions.  I also think as she is      quickly approaching her 2-year anniversary from her surgery      that we proceed with bone density study.  She was given a      prescription and will have this done at [facility] and have the      reports faxed.  2. She will see Dr. Darnelle Catalan in 3 months and return to see me in 6      months.      [name], MD  Electronically Signed     PAG/MEDQ  D:  09/13/2007  T:  09/14/2007  Job:  [id number]   cc:   [name], R.N.  [address]  [place], [place] [address]   [name], M.D.  Fax: [phone]   [name]. [name], M.D.  Fax: [phone]

## 2011-02-06 NOTE — Consult Note (Signed)
NAMEPAYTIENCE, BURES                ACCOUNT NO.:  [id number]   MEDICAL RECORD NO.:  [id number]          PATIENT TYPE:  OUT   LOCATION:  GYN                          FACILITY:  [facility]   PHYSICIAN:  [name], MD    DATE OF BIRTH:  Oct 12, 1975   DATE OF CONSULTATION:  01/05/2006  DATE OF DISCHARGE:                                   CONSULTATION   ONCOLOGY CONSULTATION   HISTORY OF PRESENT ILLNESS:  Vernica is a very pleasant 35 year old gravida 0  who underwent appropriate staging by myself and Dr. Gildardo Griffes November 03, 2005 for a stage IC clear cell carcinoma of the ovary.  She subsequently  underwent definitive therapy consisting of a laparoscopic hysterectomy, left  salpingo-oophorectomy and appropriate staging.  She comes in today for  interval evaluation.  She has undergone two cycles of Taxol and carboplatin  for her ovarian carcinoma and is due for her third cycle this coming  Thursday.  She is overall tolerating her chemotherapy quite well.  The port  is without significant sequelae.  She is on Vivelle-Dot.  She denies any hot  flashes or any neuropathy.  She states that if all stays on schedule she is  due to complete her chemotherapy March 11, 2006 and plans to go back to work  in July and she and her husband are planning a party and reception for their  wedding in October 2007.  She is feeling quite well.  Emotionally she seems  to be doing very well.  She did have some difficulty when her husband shaved  her head but is dealing very well with that now and has had a remarkable  attitude.   PHYSICAL EXAMINATION:  VITAL SIGNS:  Weight 178 pounds.  Well-nourished,  well-developed female in no acute distress.  NECK:  Supple.  There is no lymphadenopathy.  Her port is clean. Suture is  visible at the superior port site.  This is removed without difficulty.  ABDOMEN:  Soft, nontender.  PELVIC:  Bimanual examination reveals no masses or nodularity.  Rectal  examination  confirms.   ASSESSMENT:  35 year old with a stage IC ovarian carcinoma which is mixed  with well-differentiated endometrioid mucinous with a focus of clear cell  carcinoma which was a grade 3.  She comes in today just prior to her third  cycle of Taxol-based chemotherapy. She is doing quite well and is tolerating  chemotherapy well.   PLAN:  I would like to see her when she completes her sixth cycle.  I will  proceed with CT scan imaging consisting of a CT scan of the abdomen and  pelvis after the completion with a visit with [place] a month later and then we  can begin her routine surveillance.      [name], MD  Electronically Signed     PAG/MEDQ  D:  01/05/2006  T:  01/06/2006  Job:  [id number]   cc:   Pershing Cox, M.D.  Fax: [phone]   [name], M.D.  Fax: [phone]   [name], R.N.  [phone]  [address]  [place], [place] [address]

## 2011-02-06 NOTE — Op Note (Signed)
[name], [name]                 ACCOUNT NO.:  [id number]   MEDICAL RECORD NO.:  [id number]          PATIENT TYPE:  AMB   LOCATION:  SDC                           FACILITY:  [facility]   PHYSICIAN:  Pershing Cox, M.D.DATE OF BIRTH:  [birth year]   DATE OF PROCEDURE:  10/06/2005  DATE OF DISCHARGE:                                 OPERATIVE REPORT   PREOPERATIVE DIAGNOSES:  1.  Pelvic pain.  2.  Bilateral hydrosalpinx on ultrasound.   POSTOPERATIVE DIAGNOSES:  1.  Mucinous tumor of the right ovary.  2.  Desire for permanent sterilization.   PROCEDURES:  1.  Diagnostic laparoscopy.  2.  Right salpingo-oophorectomy.  3.  Cautery of the left fallopian tube.  4.  Cervical curettage.  5.  Endometrial biopsy.   SURGEON:  Pershing Cox, M.D.   ASSISTANT SURGEON:  Genia Del, M.D.   ANESTHESIA:  General by endotracheal tube and 0.25% Marcaine for skin  incisions.   SPECIMENS:  Peritoneal washings, right fallopian tube and ovary, cyst fluid,  endocervical curettage, and endometrial biopsy.   ESTIMATED BLOOD LOSS:  Less than 50 mL.   COMPLICATIONS:  None.   For details of the patient's indications for surgery, please see the history  and physical which was dictated for October 06, 2005.  Briefly, this 35-year-  old female on oral contraceptives has been well until about three months  ago, when she began to have exquisite episodes of pelvic pain and was told  two occasions that she had an ovarian cyst.  On January3, she was seen in  the emergency room at [facility] with severe pelvic pain, a  temperature and elevated white count.  She had a full workup including blood  cultures and pelvic ultrasound.  Her temperature resolved rapidly and was  normal within 12 hours, blood cultures were negative.  A transvaginal  sonogram showed a large complex mass on the right separate from the ovary,  which was felt to be normal.  The patient has been in a relationship for 10  years and it is clear that she does not want to ever have a pregnancy.  Her  desire in coming to the operating room today was to rid herself of the  pelvic pain that she had been experiencing, and she requested that if  hysterectomy was not performed  that I cauterize her normal left fallopian  tube.  This was our understanding when we entered the operating room.   The abdominal wall was free of adhesions.  There was no evidence of ascites  and no evidence of peritoneal implant.  The omentum was normal in  appearance, as was the liver.  The patient's appendix was visualized and  photographed and was normal.  Visualization of the pelvis showed a normal-  appearing uterus, left fallopian tube and ovary.  On the patient's right,  the fallopian tube was normal and there was a huge complex mass of the right  ovary.  This was cystic in most places but was lobulated.  There were no  excrescences.  As we began the  dissection, we noted find adhesions of the  ovary to the pelvic peritoneum.  All of these adhesions were later removed  for tissue diagnosis.   PROCEDURE:  [name] was brought to the operating room with an IV in  place.  She received a gram of Ancef in the holding area.  Thigh-high PAS  stockings were placed on her lower extremities and used throughout the case  and in the postoperative period.  She was placed supine on the OR table and  general endotracheal anesthesia was administered without difficulty.  She  was then placed into Allen stirrups and the upper abdominal wall, umbilicus,  perineum and vagina were prepped with a solution of Betadine.  Bimanual  examination was performed and the uterus appeared to be retroflexed.  A  bivalve speculum was inserted to the vagina.  The cervix was grasped with a  single-tooth tenaculum on the anterior cervix and attempt was made to place  a Hulka tenaculum in the posterior direction.  This was not successful, and  therefore an intubation  catheter was placed and secured to the tenaculum for  manipulation of the uterus during the procedure.   The patient was draped for a laparoscopic procedure.  Marcaine was instilled  into the subumbilical space and a vertical incision was made from the base  of the umbilicus extending about a centimeter.  Subcutaneous tissues were  spread with a Kocher clamp.  Heaney clamps were used to raise the  peritoneum, which was opened atraumatically.  The peritoneum was opened as  an individual layer.  There was no injury to underlying structures, and this  was documented by the laparoscope after it was introduced.  A 0 Vicryl was  used to place a pursestring suture around the fascia and a Hasson cannula  was placed and secured to the pursestring suture.  CO2 gas was insufflated  to develop a pneumoperitoneum.  Once we had visualized the uterus and there  were no anterior wall adhesions, two 5 mm trocars were placed in the lower  uterine segment.  We visualized the vessels underneath and by  transillumination prior to placing Marcaine 0.25% and then the incision and  5 mm trocars.  Through these trocars our instruments were used to manipulate  the procedure.  The patient's ovary was raised and photographs were taken of  the ovary and fallopian tubes on both sides.  We also took pictures the  patient's appendix and the upper abdomen.  Next, a decision was made to  proceed with a right salpingo-oophorectomy and the right ureter was noted.  This was far below the infundibulopelvic ligament, which was then grasped  with tripolar cautery, cauterized four times and then incised.  Gathering  the tissues with the tripolar cautery, the mesosalpinx was incised to the  level of the uterus.  We then came from the patient's left side with the  tripolar cautery and incised over the utero-ovarian ligament and were  eventually able to separate the ovary from this structure.  There were adhesions of the ovary to  the deep right pelvis and cul-de-sac.  These were  loose adhesions and were taken down with blunt dissection.  Later in the  procedure all of these were removed with sharp dissection for permanent  section analysis.   The trocar on the patient's right was used as an insertion site for a 5 mm  camera and the EndoCatch bag was then used to try and gather up the ovary.  We  were able to place the ovary and the bag but unable to close the bag.  A  lot of time was spent trying to get the ovary to fit in the bag.  Because of  the adhesive disease to the pelvis, we were less concerned that there would  be some leakage from this procedure and actually made a decision to aspirate  with a needle aspirator in order to get the cystic structure to collapse  enough so that the ovary could be removed.  The ovary was gathered in the  EndoCatch bag, which was then brought through the umbilical incision.  The  bag was held as the cyst was decompressed, dragging tissue through that  incision site with the bag surrounding the wound.  Eventually the bag and  the remaining contents were removed.  The bag was not disrupted at the base.   This specimen was then sent to the pathologist, who later returned an  inconclusive diagnosis.  The frozen section showed what appeared to mucinous  cells, but they were not clearly ligament.  Dr. _Sroden_________ was  concerned that this might be a malignancy metastatic to the ovary and for  this reason, the decision was made not to go any further with an operative  procedure.  We spent some time re-exposing the appendix and photographing it  in its entirety.  We explored the peritoneal cavity and actually floated the  omentum on fluid so that it could be adequately seen.  The peritoneal cavity  was evacuated and reinspected for any evidence of bleeding, and there was  none.  Lactated Ringer's 300 mL were instilled in the peritoneal cavity and  washed by placing the patient  into steep Trendelenburg and then reverse  Trendelenburg.  At this point the 5 mm trocars were removed under direct  visualization and there was no bleeding.  The umbilical incision had had to  be extended during the removal of the EndoCatch bag.  This incision was  closed initially with two interrupted 0 Vicryl sutures and then a new  pursestring.  At the closure, the Hasson cannula was removed and this new  pursestring suture was tied.  Vicryl 0 was used to close the subcutaneous  tissues and 4-0 Vicryl was used to close the skin.  Dermabond was used to  close the 5 mm trocar sites.   The manipulating tools were removed from the uterus and the bivalve speculum  was reinserted.  The Kevorkian curette was used to obtain endocervical  curettings onto a Telfa.  A uterine sound then passed into the endometrial  cavity in a midline position.  There was a sense of resistance at the upper  endocervix, but the sound passed easily.  In doing endometrial curettings, the area of resistance was felt both on the anterior endocervix and the  posterior endocervix.  These tissue specimens were sent as endocervical  curettings and endometrial curettings and were done because of the diagnosis  of mucinous tumor, knowing that the patient had had a normal Pap smear in  the past; however, hysterectomy would be totally inappropriate if she had an  endocervical tumor.   The patient was awakened on the OR table and taken to the recovery room in  good condition.      Pershing Cox, M.D.  Electronically Signed     MAJ/MEDQ  D:  10/06/2005  T:  10/06/2005  Job:  [id number]

## 2011-02-06 NOTE — H&P (Signed)
[name], [name]                 ACCOUNT NO.:  [id number]   MEDICAL RECORD NO.:  [id number]          PATIENT TYPE:  AMB   LOCATION:  SDC                           FACILITY:  [facility]   PHYSICIAN:  Pershing Cox, M.D.DATE OF BIRTH:  08/28/1976   DATE OF ADMISSION:  DATE OF DISCHARGE:                                HISTORY & PHYSICAL   ADMITTING DIAGNOSES:  1.  Pelvic pain.  2.  Complex adnexal mass.   HISTORY OF PRESENT ILLNESS:  [name] is a 35 year old gravida 0, single  white female who presented to my office in mid December for evaluation.  She  was on oral contraceptives but had had two episodes of severe pelvic pain  evaluated by sonogram in [facility].  She came to me for a second  opinion.  At the time of this visit, her pelvic examination showed a very  tender cervix on examination but no other significant abnormal findings.  The patient had a gonorrhea and Chlamydia screen collected, both of which  were normal.  She was to return for a transvaginal sonogram but was admitted  to [facility] on September 23, 2005 with severe pelvic pain.  At that  time, she had a temperature of 102 and an elevated white count.  She had a  full work-up, including blood cultures and pelvic ultrasound.  Her  temperature resolved and was normal within 12 hours.  Her blood cultures  were negative.  She had a transvaginal sonogram which showed a large complex  mass in the right adnexa separate from the ovary, which was normal.   The patient was able to be discharged from the hospital and was seen in my  office that same day.  After this episode, she was eager to proceed with a  surgical procedure because she does not want the chance of having another  episode of this kind of pelvic pain associated with her menstrual period.  She is admitted following diagnostic laparoscopy and management of any  abnormality found at the time of this diagnostic laparoscopy.  In  consultation, the  patient is adamant that she does not want future  pregnancies, and if the treatment necessary to resolve this problem includes  hysterectomy, then that is just fine with her.   ALLERGIES:  None known.   MEDICATIONS:  Yasmin oral contraceptive.   SERIOUS MEDICAL ILLNESSES:  The patient has no serious medical illness other  than recurrent ovarian cysts.  She has had a wisdom tooth extracted in the  past.   FAMILY HISTORY:  The patient's maternal and paternal grandfathers have  diabetes.  The patient's maternal grandmother has breast cancer.  The  patient's mother and sister have a history of cervical cancer and her mother  died of this disease.   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  She smokes a half a pack of cigarettes a day and rarely uses  alcohol.  She does not exercise currently because of her pelvic pain.   REVIEW OF SYSTEMS:  The complete window  of review of systems is reviewed in  detail with the patient.  She reported some breast pain, which had resolved  the month after she had the experience.  She has bleeding and pain with  intercourse.  She has had three episodes of excruciating pelvic pain  associated with a menstrual period.   PHYSICAL EXAMINATION:  GENERAL:  Well-developed, well-nourished Caucasian  female.  At the time of this examination, there is no acute distress.  SKIN:  No noted skin lesions.  HEENT:  Normocephalic and atraumatic.  EOMI.  PERRLA.  Carotid arteries are  brisk with no bruits.  The thyroid is normal to palpation.  HEART:  Regular rate and rhythm.  LUNGS:  Clear to auscultation.  BREASTS:  Both breasts are palpated and are symmetric without discharge.  No  masses are noted.  ABDOMEN:  No guarding, no rebound, no hepatosplenomegaly, no evidence of  ascites.  PELVIC:  Normal external genitalia.  Vulva is normal in appearance.  Perineum is normal.  No Bartholin's enlargement.  Urethra is normal.   Speculum examination shows a normal-appearing cervix, which is nulliparous.  Bimanual examination shows exquisite tenderness to touching of the cervix  but the uterus is nontender.  There are no palpable adnexal masses on  examination.  MUSCULOSKELETAL SYSTEM:  Normal.  NEUROPSYCHIATRIC:  Mood and affect normal.   The patient's ultrasound shows a normal uterus.  It shows a 4 mm stripe with  no evidence of fluid.  There is a complex mass in the right adnexa extending  to the midline, which is separate from the ovary.  It is thick walled and  appears tubular.  In the left adnexa, the ovary is visualized and is  unremarkable.  There is also a thin-walled tubular structure, which is  associated with this ovary.  There is free fluid seen in both adnexa and  around the uterus.   ASSESSMENT:  Complex pelvic mass with associated pelvic pain and  dyspareunia.   PLAN:  Diagnostic laparoscopy with possible salpingectomy, possible  oophorectomy, possible total abdominal hysterectomy.      Pershing Cox, M.D.  Electronically Signed     MAJ/MEDQ  D:  10/05/2005  T:  10/05/2005  Job:  [id number]

## 2011-02-06 NOTE — Consult Note (Signed)
[name], [name]                ACCOUNT NO.:  [id number]   MEDICAL RECORD NO.:  [id number]          PATIENT TYPE:  OUT   LOCATION:  GYN                          FACILITY:  [facility]   PHYSICIAN:  [name], MD    DATE OF BIRTH:  22-Apr-1976   DATE OF CONSULTATION:  07/27/2006  DATE OF DISCHARGE:                                   CONSULTATION   The patient is a very pleasant 35 year old gravida 0 who was diagnosed with  stage IC primarily endometrioid, but clear cell component ovarian carcinoma.  She underwent a right salpingo-oophorectomy for presumed benign disease in  January 2007.  Pathology was consistent with stage IC with positive  washings.  She subsequently underwent laparoscopic hysterectomy, LSO,  appropriate staging in February 2007.  She did well postoperatively.  Final  pathology revealed all lymph nodes to be negative.  Within the left ovary,  there was a 0.1-cm focus of metastatic carcinoma involving the surface  adhesions.  She was referred to Dr. Darnelle Catalan for chemotherapy and completed  6 cycles of adjuvant Taxol and carboplatin-based chemotherapy in July 2007.  She did have a post-treatment CT scan which revealed a 3.4 x 2.6 cm cystic  area in the area of the left adnexa, which is most consistent with a seroma  versus lymphocyst.  She had a follow-up ultrasound on November 2 that  revealed a 3.8 x 2.4 x 2.5 cm cystic lesion along the left pelvic sidewall.  It had an imperceptible wall with increased transmission; no soft tissue  component or blood flow; was most consistent with a small lymphocele or  seroma.  She also normal CA-125 and was seen by Dr. Darnelle Catalan 2 days ago.   She recently returned from [place] were she and her husband went for their  honeymoon.  They had a wonderful time.  She is feeling very well.  She is on  Vivelle-Dot 0.0375 mg.  She states that she really had no hot flashes and  was prepared to go down on the dose; however, most recently,  about a week  and a half ago, she began having increasing vasomotor symptoms.  She still  had some of her current prescription filled and continued without dropping  down on the dose.  She had states she has hot flashes about twice a day.  She has none at night.  She denies any abdominal pain; any nausea, vomiting,  fevers, chills; or early satiety, bloating.   PHYSICAL EXAMINATION:  VITAL SIGNS:  Weight 187 pounds, which is down 5  pounds from July; blood pressure 122/68.  GENERAL:  Well-nourished, well-developed female in no acute distress.  NECK:  Supple. There is no lymphadenopathy, no thyromegaly.  LUNGS:  Clear to auscultation bilaterally.  CARDIOVASCULAR EXAM:  Regular rate and rhythm.  ABDOMEN:  Shows well-healed surgical incisions.  Abdomen is soft, nontender,  nondistended.  There are no palpable masses or hepatosplenomegaly.  Groins  are negative for adenopathy.  EXTREMITIES:  There is no edema.  PELVIC:  Bimanual examination reveals no masses or nodularity, rectal  confirms.   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   PLAN:  She will return to see Dr. Darnelle Catalan in February 2008; return to see  me in June 2008.  We will continue getting CA-125 at the time of each of her  visits.  It appears that this seroma versus lymphocyst has been stable over  the past 4 months.  We will follow up with imaging based on patient's  symptomatology or physical exam findings.      [name], MD  Electronically Signed     PAG/MEDQ  D:  07/27/2006  T:  07/28/2006  Job:  [id number]   cc:   Pershing Cox, M.D.  Fax: [phone]   [name], M.D.  Fax: [phone]   [name], R.N.  [address]  [place], [place] [address]

## 2011-02-06 NOTE — Op Note (Signed)
[name], [name]                ACCOUNT NO.:  [id number]   MEDICAL RECORD NO.:  [id number]          PATIENT TYPE:  OIB   LOCATION:  1608                         FACILITY:  [facility]   PHYSICIAN:  [name], MD    DATE OF BIRTH:  1976-06-28   DATE OF PROCEDURE:  11/03/2005  DATE OF DISCHARGE:                                 OPERATIVE REPORT   PREOPERATIVE DIAGNOSIS:  At least stage I-C unstaged ovarian carcinoma.   POSTOPERATIVE DIAGNOSIS:  At least stage I-C unstaged ovarian carcinoma.   PROCEDURE PERFORMED:  1.  Laparoscopic hysterectomy.  2.  Left salpingo-oophorectomy.  3.  Bilateral pelvic and periaortic lymph node dissection.  4.  Partial omentectomy.  5.  Peritoneal biopsy.  6.  Complete ovarian cancer staging.   SURGEON:  [name], M.D.   ASSISTANTS:  [name], R.N., and Pershing Cox, M.D.   ANESTHESIA:  General.   INTRAVENOUS FLUIDS:  2400 mL.   ESTIMATED BLOOD LOSS:  100 mL.   URINE OUTPUT:  125 mL.   COMPLICATIONS:  None.   SPECIMENS:  Cervix, uterus, left tube and ovary, right infundibulopelvic  ligament, omentum, peritoneal biopsies and lymph nodes.   OPERATIVE FINDINGS:  A normal-appearing uterus.  Left tube status post  cautery and normal-appearing left adnexa.  No gross evidence of metastatic  disease.  Normal omentum.  No pathologically enlarged lymph nodes.   The patient was taken to the operating room after consent was reviewed with  her.  She was then placed in the supine position with arms tucked to her  comfort.  General anesthesia was then induced.  She was then placed in the  dorsal lithotomy position with all appropriate precautions being taken.  Shoulder blocks were then placed with all appropriate precautions.  The  abdomen was prepped in the usual sterile fashion.  The perineum was prepped  in the usual sterile fashion.  A Foley catheter was inserted into the  bladder under sterile conditions.  A speculum was  placed into the vagina.  The cervix was grasped with a single-tooth tenaculum.  The cervix was  dilated up to a #24.  The ZUMI with a small co-ring was placed.  The pneumo-  occluder was then placed in the vagina.  She was then draped in the usual  sterile fashion.  Time-out was performed.   Marcaine 0.25% was injected into her previous infraumbilical vertical skin  incision.  The skin incision was made with a knife and carried down to the  underlying fascia using Mayos.  The fascia was grasped with the Kochers and  elevated and entered sharply with Mayo scissors.  Once intraperitoneal  placement was confirmed, a UR6 was used to suture the fascial edges.  A  Hasson was then placed.  Direct visualization of the abdominal cavity with  the findings as above were noted.  The patient's abdomen was the insufflated  with CO2 gas in an effort to get the patient's pressure to 15 mmHg.  Then  she was placed in deep Trendelenburg, again with all appropriate  precautions.  Lateral  ports were then placed after injecting the skin with  0.25% Marcaine.  Five millimeter skin incisions were made with the knife,  and the trocars were placed under direct visualization.  A 10/12 suprapubic  port was placed in the usual fashion.   Our attention was first drawn to the periaortic lymphadenectomy.  The small  bowel was folded back upon its mesentery and the omentum was used to tuck  the bowel back.  Using monopolar cautery, an incision was made along the  common iliac artery on the patient's right side.  This was then dissected up  to the level of the duodenum.  The retroperitoneum was entered.  Without  difficulty the ureter was identified at the level of the psoas.  A grasper  was then placed to elevate and tent the ureter to keep it away from the area  of dissection.  The periaortic node nodal bundle on the patient's right side  was then removed using sharp dissection and monopolar cautery when  necessary.   The area was noted to be hemostatic, and the nodal bundle was  then removed through the 10/12 port.   Our attention was then drawn to the pelvis.  The retroperitoneum on the  patient's left side was entered using monopolar cautery.  The ureter was  identified on the patient's left side.  The perivesical space was opened, as  was the perirectal space.  A window was made in the peritoneum between the  infundibulopelvic vessels and the ureter.  The IP was then cauterized with  the Kleppingers and bipolar cautery and transected.  The round ligament was  then transected using monopolar cautery and the bladder flap was created  using sharp dissection and monopolar cautery as indicated.  The bladder flap  was created to the level below the ring.  The uterine artery and vein were  skeletonized.  The posterior peritoneum on the ring was then opened using  sharp dissection.  The uterine vessels on the patient's left side were then  coagulated using the Kleppingers.  Our attention was then drawn to the  patient's right side.  She had had a prior right salpingo-oophorectomy.  The  peritoneum on the patient's right side was opened using monopolar cautery  and the round ligament was transected using monopolar cautery.  The ureter  was identified.  The perivesical and perirectal spaces were opened without  difficulty.  A window was then created in the IP.  The IP previously had not  been taken.  The IP was then coagulated using bipolar cautery and  transected.  The uterine was then skeletonized on the patient's left side,  the bladder flap was completed from the patient's right side.  Kleppingers  were then used to coagulate the uterine arteries.  The uterine arteries were  then transected on the patient's right side.  The pneumo-balloon was then  insufflated with 100 mL of air and the colpotomy was performed using the co- ring as a guide.  This was performed from the 12 to approximately 8 o'clock   position.  We then drew our attention to the patient's left side.  The left  uterine vessels were then re-coagulated.  They were transected using  monopolar cautery and a colpotomy was completed.  The specimen was then  delivered through the vagina.  The pneumo-balloon was replaced and the  fascia was closed using figure-of-eight sutures of 0 Vicryl with the Endo-  Stitch.  Our attention was then drawn to perform the  omentectomy.  Using a  harmonic scalpel on a 5 and 3 level, infracolic omentectomy was performed  without difficulty.  All pedicles were noted to be hemostatic.  The omentum  was then placed in an EndoCatch bag and delivered through the suprapubic  port.  Peritoneal biopsies, including the right colic, right pelvic, bladder  flap, cul-de-sac and adhesion biopsies were at this point removed using  sharp dissection with pinpoint cautery to any particular areas of bleeding.   Our attention was then drawn to doing the lymphadenectomy on the patient's  right side.  Again, her space had been previously opened and the ureter was  identified.  The nodal bundle extending over the external iliac artery,  external iliac vein to the hypogastric was taken using sharp dissection.  The genitofemoral nerves were visualized and were noted to be spared.  The  dissection was carried from the bifurcation to the level of the circumflex  iliac vein.  The ureter was identified throughout its course.  The obturator  nerve was then identified and the obturator nodal bundle was again removed  using sharp dissection with pinpoint cautery.  The specimen was placed in an  EndoCatch bag and delivered through the 10/12 port.  The area was noted to  be hemostatic.  The ureter was noted to be free and peristaltic and away  from the area of dissection.  The obturator nerve was also noted to be  spared as was the superior vessels.  A similar procedure was performed on  the patient's left side.  Peritoneal  biopsies including the left gutter and  left sidewall were then also removed using sharp dissection with pinpoint  cautery as indicated.  The abdomen was then copiously irrigated and all  pedicles were noted to be hemostatic.  The ureters were nondilated and  peristalsing.  The abdomen was inspected under low flow and all pedicles  were noted to be hemostatic with no bleeding.  The omentum was identified  and there were no bleeding areas.  The aortic nodal ablation was also  hemostatic.  Again we copiously irrigated the abdomen and pelvis.  Under  direct visualization all ports were removed.   The infraumbilical port was then closed with several other 0 Vicryl sutures  on a UR6.  The skin was closed using 4-0 Vicryl on the suprapubic, right  lateral and infraumbilical port.  Benzoin and Steri-Strips were used.   The patient tolerated the procedure well and was extubated, taken to the recovery room in stable condition.  All instrument, needle and Ray-Tec  counts were correct x2.      [name], MD  Electronically Signed     PAG/MEDQ  D:  11/03/2005  T:  11/03/2005  Job:  [id number]   cc:   Pershing Cox, M.D.  Fax: [phone]   [name], R.N.  [address]  [place], [place] [address]

## 2011-02-06 NOTE — Consult Note (Signed)
[name], [name]                ACCOUNT NO.:  [id number]   MEDICAL RECORD NO.:  [id number]          PATIENT TYPE:  OUT   LOCATION:  GYN                          FACILITY:  [facility]   PHYSICIAN:  [name], MD    DATE OF BIRTH:  1975/11/28   DATE OF CONSULTATION:  04/20/2006  DATE OF DISCHARGE:                                   CONSULTATION   [name] is a very pleasant 35 year old who was diagnosed with a stage IC  primarily endometrioid,  but also clear cell ovarian carcinoma.  She  initially underwent a right salpingo-oophorectomy for presumed benign  disease in January  2007.  Pathology was stage I C with positive washings.  She subsequently underwent laparoscopic hysterectomy, LSO, and appropriate  staging in February 2007.  Postoperatively, she did well.  Final pathology  revealed all lymph nodes to be negative. Within the left ovary there is a  0.1 cm focus of metastatic carcinoma involving surface adhesions.  She was  then referred to Dr. Darnelle Catalan for chemotherapy and recently completed her  sixth cycle of planned adjuvant chemotherapy consisting of Taxol and  carboplatin.  She did have a post-treatment CT scan, July 24th.  It reveals  a 3.4 x 2.6-cm cystic area in the area of the left adnexa.  Per the  radiologist's report,  they were under the impression that the patient still  had her left adnexa.  Review of this scan reveals no other evidence of  metastatic disease and this is most likely due to a lymph cyst.  She herself  is doing quite well.  She is very happy that she is done with a therapy.  She would like to go back to work next week.  She and her husband have a  reception for their wedding planned in October as well as their honeymoon.   REVIEW OF SYSTEMS:  She denies any abdominal pain, any vaginal bleeding.  She is sexually active. She does have some vaginal dryness which is well  taken care of with lubricants.  She continues to have hot flashes,  occasionally  one a day or three to four times a day.  She is awakened by  them at night.  She is currently on Vivelle-Dot  0.05 mg.   PHYSICAL EXAMINATION:  Weight is 192 pounds, which is up 14 pounds since  April.  Well-nourished, well-developed female in no acute distress.  NECK:  Supple.  There is no lymphadenopathy, no thyromegaly.  LUNGS:  Clear to auscultation bilaterally.  CARDIOVASCULAR:  Regular rate and rhythm.  ABDOMEN:  She has well healed skin incisions.  The abdomen is soft,  nontender, nondistended.  No palpable masses or hepatosplenomegaly.  Groins  are negative for adenopathy.  Bimanual examination reveals no masses or  nodularity.  Rectal confirms.   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  She has no evidence  of recurrent disease.   PLAN:  1.  She was given prescription for Vivelle-Dot 0.0375 mg.  Once she comes      back from her honeymoon in October she will decrease the dose and we can      reevaluate the vasomotor symptoms.  Because of tumor with endometrioid,      my preference would be to get her off her hormone replacement therapy      and consider per Prometrium are Progestin to manage hot flashes.  2.  Will be in contact with radiology to ensure that they understand that      patient did have a bilateral salpingo-      oophorectomy.  If there is still questions that this could represent      ovary, an ultrasound performed.  Otherwise has with this most consistent      with a lymphocyst.  3.  Return to see me in three months.      [name], MD  Electronically Signed     PAG/MEDQ  D:  04/20/2006  T:  04/20/2006  Job:  [id number]   cc:   Pershing Cox, M.D.  Fax: [phone]   [name], M.D.  Fax: [phone]   [name], R.N.  [address]  [place], [place] [address]

## 2011-02-06 NOTE — Consult Note (Signed)
NAMEADALEA, HANDLER                ACCOUNT NO.:  [id number]   MEDICAL RECORD NO.:  [id number]          PATIENT TYPE:  OUT   LOCATION:  GYN                          FACILITY:  [facility]   PHYSICIAN:  [name], MD    DATE OF BIRTH:  1976-08-11   DATE OF CONSULTATION:  DATE OF DISCHARGE:                                   CONSULTATION   Dorthia is a very pleasant 35 year old gravida 0 who underwent laparoscopy and  RSO for presumed benign disease, October 06, 2005.  Pathology returned as  stage IC with positive washings.  She subsequently underwent laparoscopic  hysterectomy, LSO, and appropriate staging by myself and Dr. Carey Bullocks on  November 03, 2005.  Postoperatively, the patient has done quite well and  over the last week has had no significant complications.  She comes in today  to review pathology.  Pathology revealed that she is a stage IC.  All lymph  nodes 0/10 were negative.  Within the left ovary, there was a 0.1 cm focus  of metastatic carcinoma involving surface adhesions.  Otherwise, left ovary,  left tube, cervix, uterus, all biopsies, washings, and omentum were negative  for carcinoma.  Her final pathology is stage IC, N0, MX.  I spoke with Audray  and her husband today regarding adjuvant therapy.  They are agreeable to  this, and she tentatively has an appointment scheduled with Dr. Darnelle Catalan on  March 2nd.   She is overall feeling very well.  She is eating a regular diet.  She is  only taking Darvocet before she goes to sleep.  She is able to function  fairly well with her usual activities.  She has minimal vaginal bleeding.  She has normal return of bowel and bladder functions.   PHYSICAL EXAMINATION:  GENERAL:  A well-developed and well-nourished female  in no acute distress.  ABDOMEN:  Soft.  Appropriately tender.  Incisions are healing well.   ASSESSMENT/PLAN:  17.  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   I do not feel that she can return to work      for another three weeks from today, which would be March 13th.  She has      some forms for [place] to complete for her insurance company, which we will      do so.  2.  She has an appointment with Dr. Darnelle Catalan next Friday, March 2nd.  I      would recommend six cycles of Taxol and carboplatin based on      gynecologic/oncology group data.  3.  We will see the patient back in the interim during her chemotherapy to      evaluate her from a postoperative standpoint.  4.  We would recommend she be seen by genetic counselors for risk      stratification and genetic testing.      [name], MD  Electronically Signed     PAG/MEDQ  D:  11/10/2005  T:  11/10/2005  Job:  [id number]   cc:   Pershing Cox, M.D.  Fax: [phone]   [name], M.D.  Fax: [phone]   [name], R.N.  [address]  [place], [place] [address]

## 2011-02-26 ENCOUNTER — Other Ambulatory Visit: Payer: Self-pay | Admitting: Oncology

## 2011-02-26 ENCOUNTER — Encounter (HOSPITAL_BASED_OUTPATIENT_CLINIC_OR_DEPARTMENT_OTHER): Payer: Managed Care, Other (non HMO) | Admitting: Oncology

## 2011-02-26 DIAGNOSIS — C569 Malignant neoplasm of unspecified ovary: Secondary | ICD-10-CM

## 2011-02-26 LAB — CBC WITH DIFFERENTIAL/PLATELET
Basophils Absolute: 0 10*3/uL (ref 0.0–0.1)
EOS%: 2.1 % (ref 0.0–7.0)
HCT: 36.9 % (ref 34.8–46.6)
HGB: 12.6 g/dL (ref 11.6–15.9)
LYMPH%: 27.6 % (ref 14.0–49.7)
MCH: 28.2 pg (ref 25.1–34.0)
MCV: 82.3 fL (ref 79.5–101.0)
MONO%: 6 % (ref 0.0–14.0)
NEUT%: 63.9 % (ref 38.4–76.8)
Platelets: 231 10*3/uL (ref 145–400)

## 2011-02-27 LAB — COMPREHENSIVE METABOLIC PANEL
AST: 14 U/L (ref 0–37)
BUN: 15 mg/dL (ref 6–23)
Calcium: 9.3 mg/dL (ref 8.4–10.5)
Chloride: 105 mEq/L (ref 96–112)
Creatinine, Ser: 0.8 mg/dL (ref 0.50–1.10)
Total Bilirubin: 0.3 mg/dL (ref 0.3–1.2)

## 2011-03-05 ENCOUNTER — Encounter (HOSPITAL_BASED_OUTPATIENT_CLINIC_OR_DEPARTMENT_OTHER): Payer: Managed Care, Other (non HMO) | Admitting: Oncology

## 2011-03-05 DIAGNOSIS — Z8543 Personal history of malignant neoplasm of ovary: Secondary | ICD-10-CM

## 2011-08-24 ENCOUNTER — Other Ambulatory Visit (HOSPITAL_BASED_OUTPATIENT_CLINIC_OR_DEPARTMENT_OTHER): Payer: Managed Care, Other (non HMO) | Admitting: Lab

## 2011-08-24 ENCOUNTER — Other Ambulatory Visit: Payer: Self-pay | Admitting: Oncology

## 2011-08-24 DIAGNOSIS — C50919 Malignant neoplasm of unspecified site of unspecified female breast: Secondary | ICD-10-CM

## 2011-08-24 LAB — CBC WITH DIFFERENTIAL/PLATELET
Basophils Absolute: 0 10*3/uL (ref 0.0–0.1)
Eosinophils Absolute: 0.1 10*3/uL (ref 0.0–0.5)
HGB: 12.5 g/dL (ref 11.6–15.9)
LYMPH%: 33.5 % (ref 14.0–49.7)
MCV: 83.5 fL (ref 79.5–101.0)
MONO#: 0.3 10*3/uL (ref 0.1–0.9)
NEUT#: 3.1 10*3/uL (ref 1.5–6.5)
Platelets: 207 10*3/uL (ref 145–400)
RBC: 4.45 10*6/uL (ref 3.70–5.45)
WBC: 5.4 10*3/uL (ref 3.9–10.3)

## 2011-08-25 LAB — COMPREHENSIVE METABOLIC PANEL
Albumin: 4.5 g/dL (ref 3.5–5.2)
BUN: 14 mg/dL (ref 6–23)
CO2: 30 mEq/L (ref 19–32)
Glucose, Bld: 93 mg/dL (ref 70–99)
Potassium: 4.5 mEq/L (ref 3.5–5.3)
Sodium: 139 mEq/L (ref 135–145)
Total Bilirubin: 0.2 mg/dL — ABNORMAL LOW (ref 0.3–1.2)
Total Protein: 6.7 g/dL (ref 6.0–8.3)

## 2011-08-25 LAB — CANCER ANTIGEN 27.29: CA 27.29: 14 U/mL (ref 0–39)

## 2011-09-02 ENCOUNTER — Encounter: Payer: Self-pay | Admitting: Gynecologic Oncology

## 2011-09-02 ENCOUNTER — Ambulatory Visit: Payer: Managed Care, Other (non HMO) | Attending: Gynecologic Oncology | Admitting: Gynecologic Oncology

## 2011-09-02 VITALS — BP 108/60 | HR 58 | Temp 98.7°F | Resp 12 | Ht 66.65 in | Wt 224.4 lb

## 2011-09-02 DIAGNOSIS — C569 Malignant neoplasm of unspecified ovary: Secondary | ICD-10-CM | POA: Insufficient documentation

## 2011-09-02 NOTE — Progress Notes (Signed)
Consult Note: Gyn-Onc  [name] 35 y.o. female  CC:  Chief Complaint  Patient presents with  . Follow-up    HPI: [name] is a very pleasant 35 year old who was diagnosed with a stage IC endometrioid and clear cell ovarian carcinoma in January of 2007. She underwent 6 cycles of paclitaxel and carboplatin which she completed in July 2007. Posttreatment CT scan revealed symphysis but was otherwise negative. I last saw her in December of last year which time her exam was negative. She is a CA 125 drawn 08/24/2011 was normal at 3.6. Her CO2 5 and less than 5 for some time now. She comes in today for followup. She's very please that this is a 5 year anniversary Interval History:  She denies any abdominal pain nausea vomiting fevers chills headaches visual changes change in her bowel bladder habits. She is intentionally eating less and posterior stomach is getting smaller so she has an early satiety but she feels that because he attentionally trying to lose weight. She has lost about 10 pounds. She has been going to the gym 3 days a week. She plans on starting her mammograms at age 40. Review of Systems: As above  Current Meds:  Outpatient Encounter Prescriptions as of 09/02/2011  Medication Sig Dispense Refill  . estradiol (VAGIFEM) 25 MCG vaginal tablet Place 25 mcg vaginally 3 (three) times a week.          Allergy:  Allergies  Allergen Reactions  . Other     Seasonal & wasp(swelling)    Social Hx:   History   Social History  . Marital Status: Married    Spouse Name: N/A    Number of Children: N/A  . Years of Education: N/A   Occupational History  . Not on file.   Social History Main Topics  . Smoking status: Former Smoker    Quit date: 11/03/2005  . Smokeless tobacco: Not on file  . Alcohol Use: 0.6 oz/week    1 Glasses of wine per week  . Drug Use: No  . Sexually Active: Yes   Other Topics Concern  . Not on file   Social History Narrative  . No narrative on  file    Past Surgical Hx:  Past Surgical History  Procedure Date  . Abdominal hysterectomy 11/03/2005    TAHBSO  . Diagnostic laparoscopy 10/07/2005    exp lap    Past Medical Hx:  Past Medical History  Diagnosis Date  . Cancer 10/07/2005    Ovarian cancer    Family Hx:  Family History  Problem Relation Age of Onset  . Cancer Mother     lung cancer?  . Cancer Father     pancreatic, 2010  . Cancer Sister     Cervical (12/2003)  . Cancer Other     both grandfathers(pancreatic )    Vitals:  Blood pressure 108/60, pulse 58, temperature 98.7 F (37.1 C), temperature source Oral, resp. rate 12, height 5' 6.65" (1.693 m), weight 224 lb 6.4 oz (101.787 kg).  Physical Exam: Well-nourished well-developed female in no acute distress.  Neck: Supple no lymphadenopathy no thyromegaly.  Lungs: Clear to auscultation.  Cardiovascular exam: Regular rate and rhythm.  Breasts: No masses no nipple discharge no axillary adenopathy.  Abdomen: Soft nontender nondistended there are no palpable masses or hepatosplenomegaly.  Groins: No lymphadenopathy.  Pelvic: Normal external female genitalia. Bimanual exam reveals no masses or nodularity. Rectal confirms.  Extremities: No edema.  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 She will be released from our clinic. She was given the names of local gynecologist for followup. She will follow up with Dr. [name] as scheduled. She knows that we will be happy to see her in the future to see her should the need arise.  [name] A., MD 09/02/2011, 4:09 PM

## 2011-09-02 NOTE — Patient Instructions (Signed)
Routine gyn follow up

## 2011-12-23 ENCOUNTER — Emergency Department (HOSPITAL_COMMUNITY)
Admission: EM | Admit: 2011-12-23 | Discharge: 2011-12-23 | Disposition: A | Discharge status: Home / Self Care | Payer: Managed Care, Other (non HMO) | Attending: Emergency Medicine | Admitting: Emergency Medicine

## 2011-12-23 ENCOUNTER — Encounter (HOSPITAL_COMMUNITY): Payer: Self-pay | Admitting: *Deleted

## 2011-12-23 DIAGNOSIS — R519 Headache, unspecified: Secondary | ICD-10-CM

## 2011-12-23 DIAGNOSIS — R51 Headache: Secondary | ICD-10-CM | POA: Insufficient documentation

## 2011-12-23 DIAGNOSIS — R509 Fever, unspecified: Secondary | ICD-10-CM | POA: Insufficient documentation

## 2011-12-23 DIAGNOSIS — J329 Chronic sinusitis, unspecified: Secondary | ICD-10-CM | POA: Insufficient documentation

## 2011-12-23 DIAGNOSIS — H53149 Visual discomfort, unspecified: Secondary | ICD-10-CM | POA: Insufficient documentation

## 2011-12-23 DIAGNOSIS — J3489 Other specified disorders of nose and nasal sinuses: Secondary | ICD-10-CM | POA: Insufficient documentation

## 2011-12-23 DIAGNOSIS — R110 Nausea: Secondary | ICD-10-CM | POA: Insufficient documentation

## 2011-12-23 MED ORDER — HYDROCODONE-ACETAMINOPHEN 5-325 MG PO TABS
1.0000 | ORAL_TABLET | Freq: Once | ORAL | Status: AC
  Administered 2011-12-23: 1 via ORAL
  Filled 2011-12-23: qty 1

## 2011-12-23 MED ORDER — FLUTICASONE PROPIONATE 50 MCG/ACT NA SUSP: 2.0000 | Freq: Every day | NASAL | Status: AC

## 2011-12-23 MED ORDER — IBUPROFEN 800 MG PO TABS
800.0000 mg | ORAL_TABLET | Freq: Once | ORAL | Status: AC
  Administered 2011-12-23: 800 mg via ORAL
  Filled 2011-12-23: qty 1

## 2011-12-23 MED ORDER — ACETAMINOPHEN 500 MG PO TABS
1000.0000 mg | ORAL_TABLET | Freq: Once | ORAL | Status: DC
  Filled 2011-12-23: qty 2

## 2011-12-23 NOTE — ED Notes (Signed)
Pt states she went to see PMD a few days ago and was placed on an antibiotic and is no better. Pt states pain to temple areas of her head and sinus pressure.

## 2011-12-23 NOTE — ED Notes (Signed)
Pt took alka seltzer cold and sinus 1 hour PTA, which contains acetaminophen,. Order changed to Ibuprofen.

## 2011-12-23 NOTE — ED Provider Notes (Signed)
Medical screening examination/treatment/procedure(s) were performed by non-physician practitioner and as supervising physician I was immediately available for consultation/collaboration.  Shyler Hamill M Skyelynn Rambeau, MD 12/23/11 2200 

## 2011-12-23 NOTE — ED Notes (Signed)
Pt states she was seen and treated by pcp on Monday with no relief.

## 2011-12-23 NOTE — Discharge Instructions (Signed)
Headaches, Frequently Asked Questions MIGRAINE HEADACHES Q: What is migraine? What causes it? How can I treat it? A: Generally, migraine headaches begin as a dull ache. Then they develop into a constant, throbbing, and pulsating pain. You may experience pain at the temples. You may experience pain at the front or back of one or both sides of the head. The pain is usually accompanied by a combination of:  Nausea.   Vomiting.   Sensitivity to light and noise.  Some people (about 15%) experience an aura (see below) before an attack. The cause of migraine is believed to be chemical reactions in the brain. Treatment for migraine may include over-the-counter or prescription medications. It may also include self-help techniques. These include relaxation training and biofeedback.  Q: What is an aura? A: About 15% of people with migraine get an "aura". This is a sign of neurological symptoms that occur before a migraine headache. You may see wavy or jagged lines, dots, or flashing lights. You might experience tunnel vision or blind spots in one or both eyes. The aura can include visual or auditory hallucinations (something imagined). It may include disruptions in smell (such as strange odors), taste or touch. Other symptoms include:  Numbness.   A "pins and needles" sensation.   Difficulty in recalling or speaking the correct word.  These neurological events may last as long as 60 minutes. These symptoms will fade as the headache begins. Q: What is a trigger? A: Certain physical or environmental factors can lead to or "trigger" a migraine. These include:  Foods.   Hormonal changes.   Weather.   Stress.  It is important to remember that triggers are different for everyone. To help prevent migraine attacks, you need to figure out which triggers affect you. Keep a headache diary. This is a good way to track triggers. The diary will help you talk to your healthcare professional about your  condition. Q: Does weather affect migraines? A: Bright sunshine, hot, humid conditions, and drastic changes in barometric pressure may lead to, or "trigger," a migraine attack in some people. But studies have shown that weather does not act as a trigger for everyone with migraines. Q: What is the link between migraine and hormones? A: Hormones start and regulate many of your body's functions. Hormones keep your body in balance within a constantly changing environment. The levels of hormones in your body are unbalanced at times. Examples are during menstruation, pregnancy, or menopause. That can lead to a migraine attack. In fact, about three quarters of all women with migraine report that their attacks are related to the menstrual cycle.  Q: Is there an increased risk of stroke for migraine sufferers? A: The likelihood of a migraine attack causing a stroke is very remote. That is not to say that migraine sufferers cannot have a stroke associated with their migraines. In persons under age 40, the most common associated factor for stroke is migraine headache. But over the course of a person's normal life span, the occurrence of migraine headache may actually be associated with a reduced risk of dying from cerebrovascular disease due to stroke.  Q: What are acute medications for migraine? A: Acute medications are used to treat the pain of the headache after it has started. Examples over-the-counter medications, NSAIDs, ergots, and triptans.  Q: What are the triptans? A: Triptans are the newest class of abortive medications. They are specifically targeted to treat migraine. Triptans are vasoconstrictors. They moderate some chemical reactions in the brain.   The triptans work on receptors in your brain. Triptans help to restore the balance of a neurotransmitter called serotonin. Fluctuations in levels of serotonin are thought to be a main cause of migraine.  Q: Are over-the-counter medications for migraine  effective? A: Over-the-counter, or "OTC," medications may be effective in relieving mild to moderate pain and associated symptoms of migraine. But you should see your caregiver before beginning any treatment regimen for migraine.  Q: What are preventive medications for migraine? A: Preventive medications for migraine are sometimes referred to as "prophylactic" treatments. They are used to reduce the frequency, severity, and length of migraine attacks. Examples of preventive medications include antiepileptic medications, antidepressants, beta-blockers, calcium channel blockers, and NSAIDs (nonsteroidal anti-inflammatory drugs). Q: Why are anticonvulsants used to treat migraine? A: During the past few years, there has been an increased interest in antiepileptic drugs for the prevention of migraine. They are sometimes referred to as "anticonvulsants". Both epilepsy and migraine may be caused by similar reactions in the brain.  Q: Why are antidepressants used to treat migraine? A: Antidepressants are typically used to treat people with depression. They may reduce migraine frequency by regulating chemical levels, such as serotonin, in the brain.  Q: What alternative therapies are used to treat migraine? A: The term "alternative therapies" is often used to describe treatments considered outside the scope of conventional Western medicine. Examples of alternative therapy include acupuncture, acupressure, and yoga. Another common alternative treatment is herbal therapy. Some herbs are believed to relieve headache pain. Always discuss alternative therapies with your caregiver before proceeding. Some herbal products contain arsenic and other toxins. TENSION HEADACHES Q: What is a tension-type headache? What causes it? How can I treat it? A: Tension-type headaches occur randomly. They are often the result of temporary stress, anxiety, fatigue, or anger. Symptoms include soreness in your temples, a tightening  band-like sensation around your head (a "vice-like" ache). Symptoms can also include a pulling feeling, pressure sensations, and contracting head and neck muscles. The headache begins in your forehead, temples, or the back of your head and neck. Treatment for tension-type headache may include over-the-counter or prescription medications. Treatment may also include self-help techniques such as relaxation training and biofeedback. CLUSTER HEADACHES Q: What is a cluster headache? What causes it? How can I treat it? A: Cluster headache gets its name because the attacks come in groups. The pain arrives with little, if any, warning. It is usually on one side of the head. A tearing or bloodshot eye and a runny nose on the same side of the headache may also accompany the pain. Cluster headaches are believed to be caused by chemical reactions in the brain. They have been described as the most severe and intense of any headache type. Treatment for cluster headache includes prescription medication and oxygen. SINUS HEADACHES Q: What is a sinus headache? What causes it? How can I treat it? A: When a cavity in the bones of the face and skull (a sinus) becomes inflamed, the inflammation will cause localized pain. This condition is usually the result of an allergic reaction, a tumor, or an infection. If your headache is caused by a sinus blockage, such as an infection, you will probably have a fever. An x-ray will confirm a sinus blockage. Your caregiver's treatment might include antibiotics for the infection, as well as antihistamines or decongestants.  REBOUND HEADACHES Q: What is a rebound headache? What causes it? How can I treat it? A: A pattern of taking acute headache medications too   often can lead to a condition known as "rebound headache." A pattern of taking too much headache medication includes taking it more than 2 days per week or in excessive amounts. That means more than the label or a caregiver advises.  With rebound headaches, your medications not only stop relieving pain, they actually begin to cause headaches. Doctors treat rebound headache by tapering the medication that is being overused. Sometimes your caregiver will gradually substitute a different type of treatment or medication. Stopping may be a challenge. Regularly overusing a medication increases the potential for serious side effects. Consult a caregiver if you regularly use headache medications more than 2 days per week or more than the label advises. ADDITIONAL QUESTIONS AND ANSWERS Q: What is biofeedback? A: Biofeedback is a self-help treatment. Biofeedback uses special equipment to monitor your body's involuntary physical responses. Biofeedback monitors:  Breathing.   Pulse.   Heart rate.   Temperature.   Muscle tension.   Brain activity.  Biofeedback helps you refine and perfect your relaxation exercises. You learn to control the physical responses that are related to stress. Once the technique has been mastered, you do not need the equipment any more. Q: Are headaches hereditary? A: Four out of five (80%) of people that suffer report a family history of migraine. Scientists are not sure if this is genetic or a family predisposition. Despite the uncertainty, a child has a 50% chance of having migraine if one parent suffers. The child has a 75% chance if both parents suffer.  Q: Can children get headaches? A: By the time they reach high school, most young people have experienced some type of headache. Many safe and effective approaches or medications can prevent a headache from occurring or stop it after it has begun.  Q: What type of doctor should I see to diagnose and treat my headache? A: Start with your primary caregiver. Discuss his or her experience and approach to headaches. Discuss methods of classification, diagnosis, and treatment. Your caregiver may decide to recommend you to a headache specialist, depending upon  your symptoms or other physical conditions. Having diabetes, allergies, etc., may require a more comprehensive and inclusive approach to your headache. The National Head[employer] Foundation will provide, upon request, a list of [facility] physician members in your state. Document Released: 11/28/2003 Document Revised: 08/27/2011 Document Reviewed: 05/07/2008 [facility] Patient Information 2012 [place], [place].Sinusitis Sinuses are air pockets within the bones of your face. The growth of bacteria within a sinus leads to infection. The infection prevents the sinuses from draining. This infection is called sinusitis. SYMPTOMS  There will be different areas of pain depending on which sinuses have become infected.  The maxillary sinuses often produce pain beneath the eyes.   Frontal sinusitis may cause pain in the middle of the forehead and above the eyes.  Other problems (symptoms) include:  Toothaches.   Colored, pus-like (purulent) drainage from the nose.   Swelling, warmth, and tenderness over the sinus areas may be signs of infection.  TREATMENT  Sinusitis is most often determined by an exam.X-rays may be taken. If x-rays have been taken, make sure you obtain your results or find out how you are to obtain them. Your caregiver may give you medications (antibiotics). These are medications that will help kill the bacteria causing the infection. You may also be given a medication (decongestant) that helps to reduce sinus swelling.  HOME CARE INSTRUCTIONS   Only take over-the-counter or prescription medicines for pain, discomfort, or fever as directed  by your caregiver.   Drink extra fluids. Fluids help thin the mucus so your sinuses can drain more easily.   Applying either moist heat or ice packs to the sinus areas may help relieve discomfort.   Use saline nasal sprays to help moisten your sinuses. The sprays can be found at your local drugstore.  SEEK IMMEDIATE MEDICAL CARE IF:  You have a fever.    You have increasing pain, severe headaches, or toothache.   You have nausea, vomiting, or drowsiness.   You develop unusual swelling around the face or trouble seeing.  MAKE SURE YOU:   Understand these instructions.   Will watch your condition.   Will get help right away if you are not doing well or get worse.  Document Released: 09/07/2005 Document Revised: 08/27/2011 Document Reviewed: 04/06/2007 [facility] Patient Information 2012 [place], [place].    Take the flonase in addition to the augmentin.  If you continue having  Similar symptoms you may need further evaluation.  Follow up with your PCP.  He may want to have you evaluated by a neurologist for migraine-like symptoms.

## 2011-12-23 NOTE — ED Provider Notes (Signed)
History     CSN: [id number]  Arrival date & time 12/23/11  0941   First MD Initiated Contact with Patient 12/23/11 1035      Chief Complaint  Patient presents with  . Sinusitis  . Fever    (Consider location/radiation/quality/duration/timing/severity/associated sxs/prior treatment) HPI Comments: [facility] with [facility].  rx for augmentin.  T max 103 F.  Initially pain over frontal and maxillary sinuses.  Pain has now moved to temporal areas.    Mild nausea and photophobia.  No auras.  Has "3-4 OF THESE AS YEAR".  Several years ago  Was seen by an ENT and had CT which showed no evidence of sinusitis.  She has however had significant fever with her presentation  So i will again continue tx for sinusitis although i am suspicious for migraine headache.  Patient is a 36 y.o. female presenting with sinusitis and fever. The history is provided by the patient. No language interpreter was used.  Sinusitis  This is a recurrent problem. The problem has not changed since onset.The maximum temperature recorded prior to her arrival was 103 to 104 F. The fever has been present for less than 1 day. The pain is severe. Associated symptoms include chills, congestion and sinus pressure. Associated symptoms comments: B temporal headache . Treatments tried: on augmentin x 2 days. The treatment provided no relief.  Fever Primary symptoms of the febrile illness include fever and headaches.    Past Medical History  Diagnosis Date  . Cancer 10/07/2005    Ovarian cancer    Past Surgical History  Procedure Date  . Abdominal hysterectomy 11/03/2005    TAHBSO  . Diagnostic laparoscopy 10/07/2005    exp lap    Family History  Problem Relation Age of Onset  . Cancer Mother     lung cancer?  . Cancer Father     pancreatic, 2010  . Cancer Sister     Cervical (12/2003)  . Cancer Other     both grandfathers(pancreatic )    History  Substance Use Topics  . Smoking status:  Former Smoker    Quit date: 11/03/2005  . Smokeless tobacco: Not on file  . Alcohol Use: 0.6 oz/week    1 Glasses of wine per week    OB History    Grav Para Term Preterm Abortions TAB SAB Ect Mult Living                  Review of Systems  Constitutional: Positive for fever and chills.  HENT: Positive for congestion and sinus pressure.   Neurological: Positive for headaches.  Psychiatric/Behavioral: Negative for confusion and decreased concentration.  All other systems reviewed and are negative.    Allergies  Other  Home Medications   Current Outpatient Rx  Name Route Sig Dispense Refill  . ESTRADIOL 25 MCG VA TABS Vaginal Place 25 mcg vaginally 3 (three) times a week.      Marland Kitchen FLUTICASONE PROPIONATE 50 MCG/ACT NA SUSP Nasal Place 2 sprays into the nose daily. 16 g 0    BP 112/63  Pulse 100  Temp(Src) 101.3 F (38.5 C) (Oral)  Resp 16  Ht 5\' 7"  (1.702 m)  Wt 215 lb (97.523 kg)  BMI 33.67 kg/m2  SpO2 95%  Physical Exam  Nursing note and vitals reviewed. Constitutional: She is oriented to person, place, and time. She appears well-developed and well-nourished. She is cooperative.  Non-toxic appearance. She does not have a  sickly appearance. She appears ill. No distress.  HENT:  Head: Normocephalic and atraumatic.    Nose: Nose normal.       Although pt has mild pain in frontal and maxillary sinuses areas she localizes her worst pain to the temporal area.  Eyes: EOM are normal.  Neck: Normal range of motion.  Cardiovascular: Normal rate, regular rhythm and normal heart sounds.   Pulmonary/Chest: Effort normal and breath sounds normal. No respiratory distress. She has no wheezes.  Abdominal: Soft. She exhibits no distension. There is no tenderness.  Musculoskeletal: Normal range of motion.  Neurological: She is alert and oriented to person, place, and time. She has normal strength. No cranial nerve deficit or sensory deficit. Coordination and gait normal. GCS eye  subscore is 4. GCS verbal subscore is 5. GCS motor subscore is 6.  Skin: Skin is warm and dry.  Psychiatric: She has a normal mood and affect. Judgment normal.    ED Course  Procedures (including critical care time)  Labs Reviewed - No data to display No results found.   1. Sinusitis   2. Headache, temporal       MDM  Continue your augmentin as prescribed.  Alternate tylenol 1000 mg and ibuprofen 800 mg every 8 hrs for fever and discomfort.  Follow up with your PCP.  Suspicion for migraine headaches.        Worthy Rancher, PA 12/23/11 1056  Worthy Rancher, [place] 12/23/11 [phone]

## 2011-12-23 NOTE — ED Notes (Signed)
EDPa in to see pt for initial assessment. 

## 2012-08-11 ENCOUNTER — Other Ambulatory Visit (HOSPITAL_COMMUNITY): Payer: Self-pay | Admitting: Family Medicine

## 2012-08-11 DIAGNOSIS — E2839 Other primary ovarian failure: Secondary | ICD-10-CM

## 2012-09-22 ENCOUNTER — Other Ambulatory Visit (HOSPITAL_COMMUNITY): Payer: Managed Care, Other (non HMO)

## 2012-10-20 ENCOUNTER — Ambulatory Visit (HOSPITAL_COMMUNITY)
Admission: RE | Admit: 2012-10-20 | Discharge: 2012-10-20 | Disposition: A | Discharge status: Home / Self Care | Payer: Managed Care, Other (non HMO) | Source: Ambulatory Visit | Attending: Family Medicine | Admitting: Family Medicine

## 2012-10-20 ENCOUNTER — Other Ambulatory Visit (HOSPITAL_COMMUNITY): Payer: Self-pay | Admitting: Physician Assistant

## 2012-10-20 DIAGNOSIS — E2839 Other primary ovarian failure: Secondary | ICD-10-CM

## 2012-10-20 DIAGNOSIS — Z8543 Personal history of malignant neoplasm of ovary: Secondary | ICD-10-CM | POA: Insufficient documentation

## 2012-10-20 DIAGNOSIS — E349 Endocrine disorder, unspecified: Secondary | ICD-10-CM | POA: Insufficient documentation

## 2012-10-25 ENCOUNTER — Other Ambulatory Visit: Payer: Self-pay | Admitting: Family Medicine

## 2012-10-25 DIAGNOSIS — N644 Mastodynia: Secondary | ICD-10-CM

## 2012-11-08 ENCOUNTER — Ambulatory Visit
Admission: RE | Admit: 2012-11-08 | Discharge: 2012-11-08 | Disposition: A | Discharge status: Home / Self Care | Payer: Managed Care, Other (non HMO) | Source: Ambulatory Visit | Attending: Family Medicine | Admitting: Family Medicine

## 2012-11-08 ENCOUNTER — Other Ambulatory Visit: Payer: Self-pay | Admitting: Family Medicine

## 2012-11-08 DIAGNOSIS — N644 Mastodynia: Secondary | ICD-10-CM

## 2013-05-18 ENCOUNTER — Other Ambulatory Visit: Payer: Self-pay | Admitting: Family Medicine

## 2013-05-18 DIAGNOSIS — N63 Unspecified lump in unspecified breast: Secondary | ICD-10-CM

## 2013-05-23 ENCOUNTER — Other Ambulatory Visit: Payer: Managed Care, Other (non HMO)

## 2016-01-20 DIAGNOSIS — Z91038 Other insect allergy status: Secondary | ICD-10-CM | POA: Diagnosis not present

## 2016-01-20 DIAGNOSIS — Z713 Dietary counseling and surveillance: Secondary | ICD-10-CM | POA: Diagnosis not present

## 2016-01-21 DIAGNOSIS — S80861A Insect bite (nonvenomous), right lower leg, initial encounter: Secondary | ICD-10-CM | POA: Diagnosis not present

## 2016-01-21 DIAGNOSIS — W57XXXA Bitten or stung by nonvenomous insect and other nonvenomous arthropods, initial encounter: Secondary | ICD-10-CM | POA: Diagnosis not present

## 2016-03-04 DIAGNOSIS — D485 Neoplasm of uncertain behavior of skin: Secondary | ICD-10-CM | POA: Diagnosis not present

## 2016-03-04 DIAGNOSIS — L918 Other hypertrophic disorders of the skin: Secondary | ICD-10-CM | POA: Diagnosis not present

## 2016-03-04 DIAGNOSIS — D225 Melanocytic nevi of trunk: Secondary | ICD-10-CM | POA: Diagnosis not present

## 2016-03-04 DIAGNOSIS — L814 Other melanin hyperpigmentation: Secondary | ICD-10-CM | POA: Diagnosis not present

## 2016-03-04 DIAGNOSIS — D1801 Hemangioma of skin and subcutaneous tissue: Secondary | ICD-10-CM | POA: Diagnosis not present

## 2016-07-22 DIAGNOSIS — Z23 Encounter for immunization: Secondary | ICD-10-CM | POA: Diagnosis not present

## 2016-07-22 DIAGNOSIS — Z713 Dietary counseling and surveillance: Secondary | ICD-10-CM | POA: Diagnosis not present

## 2017-01-19 DIAGNOSIS — E785 Hyperlipidemia, unspecified: Secondary | ICD-10-CM | POA: Diagnosis not present

## 2017-01-19 DIAGNOSIS — Z713 Dietary counseling and surveillance: Secondary | ICD-10-CM | POA: Diagnosis not present

## 2017-07-21 DIAGNOSIS — E669 Obesity, unspecified: Secondary | ICD-10-CM | POA: Diagnosis not present

## 2017-07-21 DIAGNOSIS — Z23 Encounter for immunization: Secondary | ICD-10-CM | POA: Diagnosis not present

## 2017-11-03 DIAGNOSIS — J029 Acute pharyngitis, unspecified: Secondary | ICD-10-CM | POA: Diagnosis not present

## 2017-11-03 DIAGNOSIS — J069 Acute upper respiratory infection, unspecified: Secondary | ICD-10-CM | POA: Diagnosis not present

## 2018-04-25 DIAGNOSIS — M9901 Segmental and somatic dysfunction of cervical region: Secondary | ICD-10-CM | POA: Diagnosis not present

## 2018-04-27 DIAGNOSIS — M9901 Segmental and somatic dysfunction of cervical region: Secondary | ICD-10-CM | POA: Diagnosis not present

## 2018-04-28 DIAGNOSIS — M9901 Segmental and somatic dysfunction of cervical region: Secondary | ICD-10-CM | POA: Diagnosis not present

## 2018-05-02 DIAGNOSIS — M9901 Segmental and somatic dysfunction of cervical region: Secondary | ICD-10-CM | POA: Diagnosis not present

## 2018-05-04 DIAGNOSIS — M9901 Segmental and somatic dysfunction of cervical region: Secondary | ICD-10-CM | POA: Diagnosis not present

## 2018-05-05 DIAGNOSIS — M9901 Segmental and somatic dysfunction of cervical region: Secondary | ICD-10-CM | POA: Diagnosis not present

## 2018-05-09 DIAGNOSIS — M9901 Segmental and somatic dysfunction of cervical region: Secondary | ICD-10-CM | POA: Diagnosis not present

## 2018-05-11 DIAGNOSIS — M9901 Segmental and somatic dysfunction of cervical region: Secondary | ICD-10-CM | POA: Diagnosis not present

## 2018-05-12 DIAGNOSIS — M9901 Segmental and somatic dysfunction of cervical region: Secondary | ICD-10-CM | POA: Diagnosis not present

## 2018-05-13 DIAGNOSIS — E785 Hyperlipidemia, unspecified: Secondary | ICD-10-CM | POA: Diagnosis not present

## 2018-05-13 DIAGNOSIS — E669 Obesity, unspecified: Secondary | ICD-10-CM | POA: Diagnosis not present

## 2018-05-13 DIAGNOSIS — Z713 Dietary counseling and surveillance: Secondary | ICD-10-CM | POA: Diagnosis not present

## 2018-05-18 DIAGNOSIS — M9901 Segmental and somatic dysfunction of cervical region: Secondary | ICD-10-CM | POA: Diagnosis not present

## 2018-05-24 DIAGNOSIS — M9901 Segmental and somatic dysfunction of cervical region: Secondary | ICD-10-CM | POA: Diagnosis not present

## 2018-05-25 DIAGNOSIS — M9901 Segmental and somatic dysfunction of cervical region: Secondary | ICD-10-CM | POA: Diagnosis not present

## 2018-05-26 DIAGNOSIS — M9901 Segmental and somatic dysfunction of cervical region: Secondary | ICD-10-CM | POA: Diagnosis not present

## 2018-05-30 DIAGNOSIS — M9901 Segmental and somatic dysfunction of cervical region: Secondary | ICD-10-CM | POA: Diagnosis not present

## 2018-06-01 DIAGNOSIS — M9901 Segmental and somatic dysfunction of cervical region: Secondary | ICD-10-CM | POA: Diagnosis not present

## 2018-06-02 DIAGNOSIS — M9901 Segmental and somatic dysfunction of cervical region: Secondary | ICD-10-CM | POA: Diagnosis not present

## 2018-06-06 DIAGNOSIS — M9901 Segmental and somatic dysfunction of cervical region: Secondary | ICD-10-CM | POA: Diagnosis not present

## 2018-06-08 DIAGNOSIS — M9901 Segmental and somatic dysfunction of cervical region: Secondary | ICD-10-CM | POA: Diagnosis not present

## 2018-06-13 DIAGNOSIS — M9901 Segmental and somatic dysfunction of cervical region: Secondary | ICD-10-CM | POA: Diagnosis not present

## 2018-06-15 DIAGNOSIS — M9901 Segmental and somatic dysfunction of cervical region: Secondary | ICD-10-CM | POA: Diagnosis not present

## 2018-06-16 DIAGNOSIS — M9901 Segmental and somatic dysfunction of cervical region: Secondary | ICD-10-CM | POA: Diagnosis not present

## 2018-06-20 DIAGNOSIS — M9901 Segmental and somatic dysfunction of cervical region: Secondary | ICD-10-CM | POA: Diagnosis not present

## 2018-06-21 DIAGNOSIS — M9901 Segmental and somatic dysfunction of cervical region: Secondary | ICD-10-CM | POA: Diagnosis not present

## 2018-06-27 DIAGNOSIS — M9901 Segmental and somatic dysfunction of cervical region: Secondary | ICD-10-CM | POA: Diagnosis not present

## 2018-06-29 DIAGNOSIS — M9901 Segmental and somatic dysfunction of cervical region: Secondary | ICD-10-CM | POA: Diagnosis not present

## 2018-07-04 DIAGNOSIS — M9901 Segmental and somatic dysfunction of cervical region: Secondary | ICD-10-CM | POA: Diagnosis not present

## 2018-07-05 DIAGNOSIS — M9901 Segmental and somatic dysfunction of cervical region: Secondary | ICD-10-CM | POA: Diagnosis not present

## 2018-07-11 DIAGNOSIS — M9901 Segmental and somatic dysfunction of cervical region: Secondary | ICD-10-CM | POA: Diagnosis not present

## 2018-07-12 DIAGNOSIS — M9901 Segmental and somatic dysfunction of cervical region: Secondary | ICD-10-CM | POA: Diagnosis not present

## 2018-07-13 DIAGNOSIS — M9901 Segmental and somatic dysfunction of cervical region: Secondary | ICD-10-CM | POA: Diagnosis not present

## 2018-07-18 DIAGNOSIS — M9901 Segmental and somatic dysfunction of cervical region: Secondary | ICD-10-CM | POA: Diagnosis not present

## 2018-07-19 DIAGNOSIS — M9901 Segmental and somatic dysfunction of cervical region: Secondary | ICD-10-CM | POA: Diagnosis not present

## 2018-07-21 DIAGNOSIS — M9901 Segmental and somatic dysfunction of cervical region: Secondary | ICD-10-CM | POA: Diagnosis not present

## 2018-07-25 DIAGNOSIS — M9901 Segmental and somatic dysfunction of cervical region: Secondary | ICD-10-CM | POA: Diagnosis not present

## 2018-07-27 DIAGNOSIS — M9901 Segmental and somatic dysfunction of cervical region: Secondary | ICD-10-CM | POA: Diagnosis not present

## 2018-08-04 DIAGNOSIS — M9901 Segmental and somatic dysfunction of cervical region: Secondary | ICD-10-CM | POA: Diagnosis not present

## 2018-08-08 DIAGNOSIS — M9901 Segmental and somatic dysfunction of cervical region: Secondary | ICD-10-CM | POA: Diagnosis not present

## 2018-08-10 DIAGNOSIS — M9901 Segmental and somatic dysfunction of cervical region: Secondary | ICD-10-CM | POA: Diagnosis not present

## 2018-08-11 DIAGNOSIS — M9901 Segmental and somatic dysfunction of cervical region: Secondary | ICD-10-CM | POA: Diagnosis not present

## 2018-08-12 DIAGNOSIS — Z713 Dietary counseling and surveillance: Secondary | ICD-10-CM | POA: Diagnosis not present

## 2018-08-12 DIAGNOSIS — E669 Obesity, unspecified: Secondary | ICD-10-CM | POA: Diagnosis not present

## 2018-08-12 DIAGNOSIS — Z7689 Persons encountering health services in other specified circumstances: Secondary | ICD-10-CM | POA: Diagnosis not present

## 2018-08-15 DIAGNOSIS — M9901 Segmental and somatic dysfunction of cervical region: Secondary | ICD-10-CM | POA: Diagnosis not present

## 2018-08-16 DIAGNOSIS — M9901 Segmental and somatic dysfunction of cervical region: Secondary | ICD-10-CM | POA: Diagnosis not present

## 2018-08-22 DIAGNOSIS — M9901 Segmental and somatic dysfunction of cervical region: Secondary | ICD-10-CM | POA: Diagnosis not present

## 2018-08-24 DIAGNOSIS — M9901 Segmental and somatic dysfunction of cervical region: Secondary | ICD-10-CM | POA: Diagnosis not present

## 2018-08-25 DIAGNOSIS — M9901 Segmental and somatic dysfunction of cervical region: Secondary | ICD-10-CM | POA: Diagnosis not present

## 2018-08-30 DIAGNOSIS — M9901 Segmental and somatic dysfunction of cervical region: Secondary | ICD-10-CM | POA: Diagnosis not present

## 2018-09-05 DIAGNOSIS — M9901 Segmental and somatic dysfunction of cervical region: Secondary | ICD-10-CM | POA: Diagnosis not present

## 2018-09-07 DIAGNOSIS — M9901 Segmental and somatic dysfunction of cervical region: Secondary | ICD-10-CM | POA: Diagnosis not present

## 2018-09-08 DIAGNOSIS — M9901 Segmental and somatic dysfunction of cervical region: Secondary | ICD-10-CM | POA: Diagnosis not present

## 2018-09-12 DIAGNOSIS — M9901 Segmental and somatic dysfunction of cervical region: Secondary | ICD-10-CM | POA: Diagnosis not present

## 2018-09-19 DIAGNOSIS — M9901 Segmental and somatic dysfunction of cervical region: Secondary | ICD-10-CM | POA: Diagnosis not present

## 2018-09-22 DIAGNOSIS — M9901 Segmental and somatic dysfunction of cervical region: Secondary | ICD-10-CM | POA: Diagnosis not present

## 2018-09-26 DIAGNOSIS — M9901 Segmental and somatic dysfunction of cervical region: Secondary | ICD-10-CM | POA: Diagnosis not present

## 2018-09-27 DIAGNOSIS — M9901 Segmental and somatic dysfunction of cervical region: Secondary | ICD-10-CM | POA: Diagnosis not present

## 2018-10-03 DIAGNOSIS — M9901 Segmental and somatic dysfunction of cervical region: Secondary | ICD-10-CM | POA: Diagnosis not present

## 2018-10-05 DIAGNOSIS — M9901 Segmental and somatic dysfunction of cervical region: Secondary | ICD-10-CM | POA: Diagnosis not present

## 2018-10-17 DIAGNOSIS — M9901 Segmental and somatic dysfunction of cervical region: Secondary | ICD-10-CM | POA: Diagnosis not present

## 2018-10-18 DIAGNOSIS — M9901 Segmental and somatic dysfunction of cervical region: Secondary | ICD-10-CM | POA: Diagnosis not present

## 2018-10-20 DIAGNOSIS — M9901 Segmental and somatic dysfunction of cervical region: Secondary | ICD-10-CM | POA: Diagnosis not present

## 2018-10-24 DIAGNOSIS — M9901 Segmental and somatic dysfunction of cervical region: Secondary | ICD-10-CM | POA: Diagnosis not present

## 2018-10-31 DIAGNOSIS — M9901 Segmental and somatic dysfunction of cervical region: Secondary | ICD-10-CM | POA: Diagnosis not present

## 2018-11-03 DIAGNOSIS — M9901 Segmental and somatic dysfunction of cervical region: Secondary | ICD-10-CM | POA: Diagnosis not present

## 2018-11-07 DIAGNOSIS — M9901 Segmental and somatic dysfunction of cervical region: Secondary | ICD-10-CM | POA: Diagnosis not present

## 2018-11-08 DIAGNOSIS — M9901 Segmental and somatic dysfunction of cervical region: Secondary | ICD-10-CM | POA: Diagnosis not present

## 2018-11-11 DIAGNOSIS — Z6830 Body mass index (BMI) 30.0-30.9, adult: Secondary | ICD-10-CM | POA: Diagnosis not present

## 2018-11-11 DIAGNOSIS — E669 Obesity, unspecified: Secondary | ICD-10-CM | POA: Diagnosis not present

## 2018-11-11 DIAGNOSIS — Z713 Dietary counseling and surveillance: Secondary | ICD-10-CM | POA: Diagnosis not present

## 2018-11-17 DIAGNOSIS — M9901 Segmental and somatic dysfunction of cervical region: Secondary | ICD-10-CM | POA: Diagnosis not present

## 2018-11-21 DIAGNOSIS — M9901 Segmental and somatic dysfunction of cervical region: Secondary | ICD-10-CM | POA: Diagnosis not present

## 2018-11-22 DIAGNOSIS — M9901 Segmental and somatic dysfunction of cervical region: Secondary | ICD-10-CM | POA: Diagnosis not present

## 2018-11-23 DIAGNOSIS — M9901 Segmental and somatic dysfunction of cervical region: Secondary | ICD-10-CM | POA: Diagnosis not present

## 2018-12-01 DIAGNOSIS — M9901 Segmental and somatic dysfunction of cervical region: Secondary | ICD-10-CM | POA: Diagnosis not present

## 2018-12-05 DIAGNOSIS — M9901 Segmental and somatic dysfunction of cervical region: Secondary | ICD-10-CM | POA: Diagnosis not present

## 2018-12-19 DIAGNOSIS — M9901 Segmental and somatic dysfunction of cervical region: Secondary | ICD-10-CM | POA: Diagnosis not present

## 2018-12-20 DIAGNOSIS — M9901 Segmental and somatic dysfunction of cervical region: Secondary | ICD-10-CM | POA: Diagnosis not present

## 2018-12-29 DIAGNOSIS — M9901 Segmental and somatic dysfunction of cervical region: Secondary | ICD-10-CM | POA: Diagnosis not present

## 2019-01-03 DIAGNOSIS — M9901 Segmental and somatic dysfunction of cervical region: Secondary | ICD-10-CM | POA: Diagnosis not present

## 2019-01-11 DIAGNOSIS — M9901 Segmental and somatic dysfunction of cervical region: Secondary | ICD-10-CM | POA: Diagnosis not present

## 2019-01-16 DIAGNOSIS — M9901 Segmental and somatic dysfunction of cervical region: Secondary | ICD-10-CM | POA: Diagnosis not present

## 2019-01-26 DIAGNOSIS — M9901 Segmental and somatic dysfunction of cervical region: Secondary | ICD-10-CM | POA: Diagnosis not present

## 2019-01-31 DIAGNOSIS — M9901 Segmental and somatic dysfunction of cervical region: Secondary | ICD-10-CM | POA: Diagnosis not present

## 2019-02-08 DIAGNOSIS — M9901 Segmental and somatic dysfunction of cervical region: Secondary | ICD-10-CM | POA: Diagnosis not present

## 2019-02-09 DIAGNOSIS — Z6832 Body mass index (BMI) 32.0-32.9, adult: Secondary | ICD-10-CM | POA: Diagnosis not present

## 2019-02-09 DIAGNOSIS — Z713 Dietary counseling and surveillance: Secondary | ICD-10-CM | POA: Diagnosis not present

## 2019-02-09 DIAGNOSIS — E669 Obesity, unspecified: Secondary | ICD-10-CM | POA: Diagnosis not present

## 2019-02-16 DIAGNOSIS — M9901 Segmental and somatic dysfunction of cervical region: Secondary | ICD-10-CM | POA: Diagnosis not present

## 2019-02-20 DIAGNOSIS — M9901 Segmental and somatic dysfunction of cervical region: Secondary | ICD-10-CM | POA: Diagnosis not present

## 2019-02-28 DIAGNOSIS — M9901 Segmental and somatic dysfunction of cervical region: Secondary | ICD-10-CM | POA: Diagnosis not present

## 2019-03-02 DIAGNOSIS — M9901 Segmental and somatic dysfunction of cervical region: Secondary | ICD-10-CM | POA: Diagnosis not present

## 2019-03-06 DIAGNOSIS — M9901 Segmental and somatic dysfunction of cervical region: Secondary | ICD-10-CM | POA: Diagnosis not present

## 2019-03-14 DIAGNOSIS — M9901 Segmental and somatic dysfunction of cervical region: Secondary | ICD-10-CM | POA: Diagnosis not present

## 2019-03-20 DIAGNOSIS — M9901 Segmental and somatic dysfunction of cervical region: Secondary | ICD-10-CM | POA: Diagnosis not present

## 2019-03-28 DIAGNOSIS — M9901 Segmental and somatic dysfunction of cervical region: Secondary | ICD-10-CM | POA: Diagnosis not present

## 2019-03-30 DIAGNOSIS — M9901 Segmental and somatic dysfunction of cervical region: Secondary | ICD-10-CM | POA: Diagnosis not present

## 2019-04-11 DIAGNOSIS — M9901 Segmental and somatic dysfunction of cervical region: Secondary | ICD-10-CM | POA: Diagnosis not present

## 2019-04-24 DIAGNOSIS — M9901 Segmental and somatic dysfunction of cervical region: Secondary | ICD-10-CM | POA: Diagnosis not present

## 2019-05-03 DIAGNOSIS — M9901 Segmental and somatic dysfunction of cervical region: Secondary | ICD-10-CM | POA: Diagnosis not present

## 2019-05-15 DIAGNOSIS — Z713 Dietary counseling and surveillance: Secondary | ICD-10-CM | POA: Diagnosis not present

## 2019-05-17 DIAGNOSIS — M9901 Segmental and somatic dysfunction of cervical region: Secondary | ICD-10-CM | POA: Diagnosis not present

## 2019-05-22 DIAGNOSIS — M9901 Segmental and somatic dysfunction of cervical region: Secondary | ICD-10-CM | POA: Diagnosis not present

## 2019-06-05 DIAGNOSIS — M9901 Segmental and somatic dysfunction of cervical region: Secondary | ICD-10-CM | POA: Diagnosis not present

## 2019-06-14 DIAGNOSIS — M9901 Segmental and somatic dysfunction of cervical region: Secondary | ICD-10-CM | POA: Diagnosis not present

## 2019-06-28 DIAGNOSIS — M9901 Segmental and somatic dysfunction of cervical region: Secondary | ICD-10-CM | POA: Diagnosis not present

## 2019-07-04 DIAGNOSIS — M9901 Segmental and somatic dysfunction of cervical region: Secondary | ICD-10-CM | POA: Diagnosis not present

## 2019-07-12 DIAGNOSIS — M9901 Segmental and somatic dysfunction of cervical region: Secondary | ICD-10-CM | POA: Diagnosis not present

## 2019-07-18 DIAGNOSIS — M9901 Segmental and somatic dysfunction of cervical region: Secondary | ICD-10-CM | POA: Diagnosis not present

## 2019-07-26 DIAGNOSIS — M9901 Segmental and somatic dysfunction of cervical region: Secondary | ICD-10-CM | POA: Diagnosis not present

## 2019-07-31 DIAGNOSIS — M9901 Segmental and somatic dysfunction of cervical region: Secondary | ICD-10-CM | POA: Diagnosis not present

## 2019-08-01 ENCOUNTER — Telehealth: Payer: Self-pay | Admitting: Gynecologic Oncology

## 2019-08-01 NOTE — Telephone Encounter (Signed)
Faxed medical records to [facility] at [phone], Release [phone]:[id number]

## 2019-08-03 ENCOUNTER — Other Ambulatory Visit: Payer: Self-pay | Admitting: Oncology

## 2019-08-10 DIAGNOSIS — R21 Rash and other nonspecific skin eruption: Secondary | ICD-10-CM | POA: Diagnosis not present

## 2019-08-10 DIAGNOSIS — Z6831 Body mass index (BMI) 31.0-31.9, adult: Secondary | ICD-10-CM | POA: Diagnosis not present

## 2019-08-10 DIAGNOSIS — R05 Cough: Secondary | ICD-10-CM | POA: Diagnosis not present

## 2019-08-10 DIAGNOSIS — L249 Irritant contact dermatitis, unspecified cause: Secondary | ICD-10-CM | POA: Diagnosis not present

## 2019-08-10 DIAGNOSIS — R197 Diarrhea, unspecified: Secondary | ICD-10-CM | POA: Diagnosis not present

## 2019-08-23 DIAGNOSIS — M9901 Segmental and somatic dysfunction of cervical region: Secondary | ICD-10-CM | POA: Diagnosis not present

## 2019-08-29 DIAGNOSIS — M9901 Segmental and somatic dysfunction of cervical region: Secondary | ICD-10-CM | POA: Diagnosis not present

## 2019-09-06 DIAGNOSIS — M9901 Segmental and somatic dysfunction of cervical region: Secondary | ICD-10-CM | POA: Diagnosis not present

## 2019-09-11 DIAGNOSIS — M9901 Segmental and somatic dysfunction of cervical region: Secondary | ICD-10-CM | POA: Diagnosis not present

## 2019-09-21 DIAGNOSIS — M9901 Segmental and somatic dysfunction of cervical region: Secondary | ICD-10-CM | POA: Diagnosis not present

## 2019-09-25 DIAGNOSIS — M9901 Segmental and somatic dysfunction of cervical region: Secondary | ICD-10-CM | POA: Diagnosis not present

## 2019-10-05 DIAGNOSIS — M9901 Segmental and somatic dysfunction of cervical region: Secondary | ICD-10-CM | POA: Diagnosis not present

## 2019-10-12 DIAGNOSIS — M9901 Segmental and somatic dysfunction of cervical region: Secondary | ICD-10-CM | POA: Diagnosis not present

## 2019-10-18 DIAGNOSIS — M9901 Segmental and somatic dysfunction of cervical region: Secondary | ICD-10-CM | POA: Diagnosis not present

## 2019-10-23 DIAGNOSIS — M9901 Segmental and somatic dysfunction of cervical region: Secondary | ICD-10-CM | POA: Diagnosis not present

## 2019-10-31 DIAGNOSIS — M9901 Segmental and somatic dysfunction of cervical region: Secondary | ICD-10-CM | POA: Diagnosis not present

## 2019-11-14 DIAGNOSIS — M9901 Segmental and somatic dysfunction of cervical region: Secondary | ICD-10-CM | POA: Diagnosis not present

## 2019-11-21 DIAGNOSIS — M9901 Segmental and somatic dysfunction of cervical region: Secondary | ICD-10-CM | POA: Diagnosis not present

## 2019-11-30 DIAGNOSIS — M9901 Segmental and somatic dysfunction of cervical region: Secondary | ICD-10-CM | POA: Diagnosis not present

## 2019-12-19 DIAGNOSIS — M9901 Segmental and somatic dysfunction of cervical region: Secondary | ICD-10-CM | POA: Diagnosis not present

## 2020-01-08 DIAGNOSIS — M9901 Segmental and somatic dysfunction of cervical region: Secondary | ICD-10-CM | POA: Diagnosis not present

## 2020-01-25 DIAGNOSIS — M9901 Segmental and somatic dysfunction of cervical region: Secondary | ICD-10-CM | POA: Diagnosis not present

## 2020-02-13 DIAGNOSIS — M9901 Segmental and somatic dysfunction of cervical region: Secondary | ICD-10-CM | POA: Diagnosis not present

## 2020-02-20 DIAGNOSIS — M9901 Segmental and somatic dysfunction of cervical region: Secondary | ICD-10-CM | POA: Diagnosis not present

## 2020-02-27 DIAGNOSIS — M9901 Segmental and somatic dysfunction of cervical region: Secondary | ICD-10-CM | POA: Diagnosis not present

## 2020-03-05 DIAGNOSIS — M9901 Segmental and somatic dysfunction of cervical region: Secondary | ICD-10-CM | POA: Diagnosis not present

## 2020-03-12 DIAGNOSIS — M9901 Segmental and somatic dysfunction of cervical region: Secondary | ICD-10-CM | POA: Diagnosis not present

## 2020-03-18 DIAGNOSIS — M9901 Segmental and somatic dysfunction of cervical region: Secondary | ICD-10-CM | POA: Diagnosis not present

## 2020-03-26 DIAGNOSIS — M9901 Segmental and somatic dysfunction of cervical region: Secondary | ICD-10-CM | POA: Diagnosis not present

## 2020-04-03 DIAGNOSIS — M9901 Segmental and somatic dysfunction of cervical region: Secondary | ICD-10-CM | POA: Diagnosis not present

## 2020-04-23 DIAGNOSIS — M9901 Segmental and somatic dysfunction of cervical region: Secondary | ICD-10-CM | POA: Diagnosis not present

## 2020-05-02 DIAGNOSIS — M9901 Segmental and somatic dysfunction of cervical region: Secondary | ICD-10-CM | POA: Diagnosis not present

## 2020-05-15 DIAGNOSIS — M9901 Segmental and somatic dysfunction of cervical region: Secondary | ICD-10-CM | POA: Diagnosis not present

## 2020-05-21 DIAGNOSIS — M9901 Segmental and somatic dysfunction of cervical region: Secondary | ICD-10-CM | POA: Diagnosis not present

## 2020-06-06 DIAGNOSIS — M9901 Segmental and somatic dysfunction of cervical region: Secondary | ICD-10-CM | POA: Diagnosis not present

## 2020-06-11 DIAGNOSIS — M9901 Segmental and somatic dysfunction of cervical region: Secondary | ICD-10-CM | POA: Diagnosis not present

## 2020-06-13 DIAGNOSIS — M9901 Segmental and somatic dysfunction of cervical region: Secondary | ICD-10-CM | POA: Diagnosis not present

## 2020-06-18 DIAGNOSIS — M9901 Segmental and somatic dysfunction of cervical region: Secondary | ICD-10-CM | POA: Diagnosis not present

## 2020-06-27 DIAGNOSIS — M9901 Segmental and somatic dysfunction of cervical region: Secondary | ICD-10-CM | POA: Diagnosis not present

## 2020-07-02 DIAGNOSIS — M9901 Segmental and somatic dysfunction of cervical region: Secondary | ICD-10-CM | POA: Diagnosis not present

## 2020-07-08 DIAGNOSIS — M9901 Segmental and somatic dysfunction of cervical region: Secondary | ICD-10-CM | POA: Diagnosis not present

## 2020-07-25 DIAGNOSIS — M9901 Segmental and somatic dysfunction of cervical region: Secondary | ICD-10-CM | POA: Diagnosis not present

## 2020-07-30 DIAGNOSIS — M9901 Segmental and somatic dysfunction of cervical region: Secondary | ICD-10-CM | POA: Diagnosis not present

## 2020-08-12 DIAGNOSIS — M9901 Segmental and somatic dysfunction of cervical region: Secondary | ICD-10-CM | POA: Diagnosis not present

## 2020-08-22 DIAGNOSIS — M9901 Segmental and somatic dysfunction of cervical region: Secondary | ICD-10-CM | POA: Diagnosis not present

## 2020-08-26 DIAGNOSIS — M9901 Segmental and somatic dysfunction of cervical region: Secondary | ICD-10-CM | POA: Diagnosis not present

## 2020-09-05 DIAGNOSIS — M9901 Segmental and somatic dysfunction of cervical region: Secondary | ICD-10-CM | POA: Diagnosis not present

## 2020-09-10 DIAGNOSIS — M9901 Segmental and somatic dysfunction of cervical region: Secondary | ICD-10-CM | POA: Diagnosis not present

## 2020-09-17 DIAGNOSIS — M9901 Segmental and somatic dysfunction of cervical region: Secondary | ICD-10-CM | POA: Diagnosis not present

## 2021-04-22 DIAGNOSIS — M9901 Segmental and somatic dysfunction of cervical region: Secondary | ICD-10-CM | POA: Diagnosis not present

## 2021-05-15 DIAGNOSIS — M9901 Segmental and somatic dysfunction of cervical region: Secondary | ICD-10-CM | POA: Diagnosis not present

## 2021-05-27 DIAGNOSIS — M9901 Segmental and somatic dysfunction of cervical region: Secondary | ICD-10-CM | POA: Diagnosis not present

## 2021-06-02 DIAGNOSIS — M9901 Segmental and somatic dysfunction of cervical region: Secondary | ICD-10-CM | POA: Diagnosis not present

## 2021-06-04 DIAGNOSIS — M9901 Segmental and somatic dysfunction of cervical region: Secondary | ICD-10-CM | POA: Diagnosis not present

## 2021-06-18 DIAGNOSIS — M9901 Segmental and somatic dysfunction of cervical region: Secondary | ICD-10-CM | POA: Diagnosis not present

## 2021-07-02 DIAGNOSIS — M9901 Segmental and somatic dysfunction of cervical region: Secondary | ICD-10-CM | POA: Diagnosis not present

## 2021-07-10 DIAGNOSIS — M9901 Segmental and somatic dysfunction of cervical region: Secondary | ICD-10-CM | POA: Diagnosis not present

## 2021-07-24 DIAGNOSIS — M9901 Segmental and somatic dysfunction of cervical region: Secondary | ICD-10-CM | POA: Diagnosis not present

## 2021-07-29 DIAGNOSIS — M9901 Segmental and somatic dysfunction of cervical region: Secondary | ICD-10-CM | POA: Diagnosis not present

## 2021-08-07 DIAGNOSIS — M9901 Segmental and somatic dysfunction of cervical region: Secondary | ICD-10-CM | POA: Diagnosis not present

## 2021-08-11 DIAGNOSIS — M9901 Segmental and somatic dysfunction of cervical region: Secondary | ICD-10-CM | POA: Diagnosis not present

## 2021-08-20 DIAGNOSIS — M9901 Segmental and somatic dysfunction of cervical region: Secondary | ICD-10-CM | POA: Diagnosis not present

## 2021-09-03 DIAGNOSIS — M9901 Segmental and somatic dysfunction of cervical region: Secondary | ICD-10-CM | POA: Diagnosis not present

## 2021-09-08 DIAGNOSIS — M9901 Segmental and somatic dysfunction of cervical region: Secondary | ICD-10-CM | POA: Diagnosis not present

## 2021-09-17 DIAGNOSIS — M9901 Segmental and somatic dysfunction of cervical region: Secondary | ICD-10-CM | POA: Diagnosis not present

## 2021-09-23 DIAGNOSIS — M9901 Segmental and somatic dysfunction of cervical region: Secondary | ICD-10-CM | POA: Diagnosis not present

## 2021-10-02 DIAGNOSIS — M9901 Segmental and somatic dysfunction of cervical region: Secondary | ICD-10-CM | POA: Diagnosis not present

## 2021-10-07 DIAGNOSIS — M9901 Segmental and somatic dysfunction of cervical region: Secondary | ICD-10-CM | POA: Diagnosis not present

## 2021-10-16 DIAGNOSIS — M9901 Segmental and somatic dysfunction of cervical region: Secondary | ICD-10-CM | POA: Diagnosis not present

## 2021-11-04 DIAGNOSIS — M9901 Segmental and somatic dysfunction of cervical region: Secondary | ICD-10-CM | POA: Diagnosis not present

## 2021-11-17 DIAGNOSIS — M9901 Segmental and somatic dysfunction of cervical region: Secondary | ICD-10-CM | POA: Diagnosis not present

## 2021-11-27 DIAGNOSIS — M9901 Segmental and somatic dysfunction of cervical region: Secondary | ICD-10-CM | POA: Diagnosis not present

## 2021-12-02 DIAGNOSIS — M9901 Segmental and somatic dysfunction of cervical region: Secondary | ICD-10-CM | POA: Diagnosis not present

## 2021-12-11 DIAGNOSIS — M9901 Segmental and somatic dysfunction of cervical region: Secondary | ICD-10-CM | POA: Diagnosis not present

## 2021-12-16 DIAGNOSIS — M9901 Segmental and somatic dysfunction of cervical region: Secondary | ICD-10-CM | POA: Diagnosis not present

## 2021-12-25 DIAGNOSIS — M9901 Segmental and somatic dysfunction of cervical region: Secondary | ICD-10-CM | POA: Diagnosis not present

## 2022-01-13 DIAGNOSIS — M9901 Segmental and somatic dysfunction of cervical region: Secondary | ICD-10-CM | POA: Diagnosis not present

## 2022-01-21 DIAGNOSIS — M9901 Segmental and somatic dysfunction of cervical region: Secondary | ICD-10-CM | POA: Diagnosis not present

## 2022-01-27 DIAGNOSIS — M9901 Segmental and somatic dysfunction of cervical region: Secondary | ICD-10-CM | POA: Diagnosis not present

## 2022-02-05 DIAGNOSIS — M9901 Segmental and somatic dysfunction of cervical region: Secondary | ICD-10-CM | POA: Diagnosis not present

## 2022-02-10 DIAGNOSIS — M9901 Segmental and somatic dysfunction of cervical region: Secondary | ICD-10-CM | POA: Diagnosis not present

## 2022-02-19 DIAGNOSIS — M9901 Segmental and somatic dysfunction of cervical region: Secondary | ICD-10-CM | POA: Diagnosis not present

## 2022-02-24 DIAGNOSIS — M9901 Segmental and somatic dysfunction of cervical region: Secondary | ICD-10-CM | POA: Diagnosis not present

## 2022-03-05 DIAGNOSIS — M9901 Segmental and somatic dysfunction of cervical region: Secondary | ICD-10-CM | POA: Diagnosis not present

## 2022-03-10 DIAGNOSIS — M9901 Segmental and somatic dysfunction of cervical region: Secondary | ICD-10-CM | POA: Diagnosis not present

## 2022-03-18 DIAGNOSIS — M9901 Segmental and somatic dysfunction of cervical region: Secondary | ICD-10-CM | POA: Diagnosis not present

## 2022-04-01 DIAGNOSIS — M9901 Segmental and somatic dysfunction of cervical region: Secondary | ICD-10-CM | POA: Diagnosis not present

## 2022-04-07 DIAGNOSIS — M9901 Segmental and somatic dysfunction of cervical region: Secondary | ICD-10-CM | POA: Diagnosis not present

## 2022-04-16 DIAGNOSIS — M9901 Segmental and somatic dysfunction of cervical region: Secondary | ICD-10-CM | POA: Diagnosis not present

## 2022-04-21 DIAGNOSIS — M9901 Segmental and somatic dysfunction of cervical region: Secondary | ICD-10-CM | POA: Diagnosis not present

## 2022-05-05 DIAGNOSIS — M9901 Segmental and somatic dysfunction of cervical region: Secondary | ICD-10-CM | POA: Diagnosis not present

## 2022-05-14 DIAGNOSIS — M9901 Segmental and somatic dysfunction of cervical region: Secondary | ICD-10-CM | POA: Diagnosis not present

## 2022-05-19 DIAGNOSIS — M9901 Segmental and somatic dysfunction of cervical region: Secondary | ICD-10-CM | POA: Diagnosis not present

## 2022-05-28 DIAGNOSIS — M9901 Segmental and somatic dysfunction of cervical region: Secondary | ICD-10-CM | POA: Diagnosis not present

## 2022-06-02 DIAGNOSIS — M9901 Segmental and somatic dysfunction of cervical region: Secondary | ICD-10-CM | POA: Diagnosis not present

## 2022-06-11 DIAGNOSIS — M9901 Segmental and somatic dysfunction of cervical region: Secondary | ICD-10-CM | POA: Diagnosis not present

## 2022-06-16 DIAGNOSIS — M9901 Segmental and somatic dysfunction of cervical region: Secondary | ICD-10-CM | POA: Diagnosis not present

## 2022-06-30 DIAGNOSIS — M9901 Segmental and somatic dysfunction of cervical region: Secondary | ICD-10-CM | POA: Diagnosis not present

## 2022-07-08 DIAGNOSIS — Z136 Encounter for screening for cardiovascular disorders: Secondary | ICD-10-CM | POA: Diagnosis not present

## 2022-07-08 DIAGNOSIS — Z713 Dietary counseling and surveillance: Secondary | ICD-10-CM | POA: Diagnosis not present

## 2022-07-08 DIAGNOSIS — Z6839 Body mass index (BMI) 39.0-39.9, adult: Secondary | ICD-10-CM | POA: Diagnosis not present

## 2022-07-08 DIAGNOSIS — Z13220 Encounter for screening for lipoid disorders: Secondary | ICD-10-CM | POA: Diagnosis not present

## 2022-07-09 DIAGNOSIS — M9901 Segmental and somatic dysfunction of cervical region: Secondary | ICD-10-CM | POA: Diagnosis not present

## 2022-07-23 DIAGNOSIS — M9901 Segmental and somatic dysfunction of cervical region: Secondary | ICD-10-CM | POA: Diagnosis not present

## 2022-07-28 DIAGNOSIS — M9901 Segmental and somatic dysfunction of cervical region: Secondary | ICD-10-CM | POA: Diagnosis not present

## 2022-08-03 DIAGNOSIS — M9901 Segmental and somatic dysfunction of cervical region: Secondary | ICD-10-CM | POA: Diagnosis not present

## 2022-08-25 DIAGNOSIS — M9901 Segmental and somatic dysfunction of cervical region: Secondary | ICD-10-CM | POA: Diagnosis not present

## 2022-09-17 DIAGNOSIS — M9901 Segmental and somatic dysfunction of cervical region: Secondary | ICD-10-CM | POA: Diagnosis not present

## 2022-09-22 DIAGNOSIS — M9901 Segmental and somatic dysfunction of cervical region: Secondary | ICD-10-CM | POA: Diagnosis not present

## 2022-10-01 DIAGNOSIS — M9901 Segmental and somatic dysfunction of cervical region: Secondary | ICD-10-CM | POA: Diagnosis not present

## 2022-10-06 DIAGNOSIS — M9901 Segmental and somatic dysfunction of cervical region: Secondary | ICD-10-CM | POA: Diagnosis not present

## 2022-10-14 DIAGNOSIS — M9901 Segmental and somatic dysfunction of cervical region: Secondary | ICD-10-CM | POA: Diagnosis not present

## 2022-10-29 DIAGNOSIS — M9901 Segmental and somatic dysfunction of cervical region: Secondary | ICD-10-CM | POA: Diagnosis not present

## 2022-11-03 DIAGNOSIS — M9901 Segmental and somatic dysfunction of cervical region: Secondary | ICD-10-CM | POA: Diagnosis not present

## 2022-11-12 DIAGNOSIS — M9901 Segmental and somatic dysfunction of cervical region: Secondary | ICD-10-CM | POA: Diagnosis not present

## 2022-11-17 DIAGNOSIS — M9901 Segmental and somatic dysfunction of cervical region: Secondary | ICD-10-CM | POA: Diagnosis not present

## 2022-12-01 DIAGNOSIS — M9901 Segmental and somatic dysfunction of cervical region: Secondary | ICD-10-CM | POA: Diagnosis not present

## 2022-12-10 DIAGNOSIS — M9901 Segmental and somatic dysfunction of cervical region: Secondary | ICD-10-CM | POA: Diagnosis not present

## 2022-12-29 DIAGNOSIS — M9901 Segmental and somatic dysfunction of cervical region: Secondary | ICD-10-CM | POA: Diagnosis not present

## 2023-01-07 DIAGNOSIS — M9901 Segmental and somatic dysfunction of cervical region: Secondary | ICD-10-CM | POA: Diagnosis not present

## 2023-01-11 DIAGNOSIS — M9901 Segmental and somatic dysfunction of cervical region: Secondary | ICD-10-CM | POA: Diagnosis not present

## 2023-01-21 DIAGNOSIS — M9901 Segmental and somatic dysfunction of cervical region: Secondary | ICD-10-CM | POA: Diagnosis not present

## 2023-02-04 DIAGNOSIS — M9901 Segmental and somatic dysfunction of cervical region: Secondary | ICD-10-CM | POA: Diagnosis not present

## 2023-02-18 DIAGNOSIS — M9901 Segmental and somatic dysfunction of cervical region: Secondary | ICD-10-CM | POA: Diagnosis not present

## 2023-02-23 DIAGNOSIS — M9901 Segmental and somatic dysfunction of cervical region: Secondary | ICD-10-CM | POA: Diagnosis not present

## 2023-03-04 DIAGNOSIS — M9901 Segmental and somatic dysfunction of cervical region: Secondary | ICD-10-CM | POA: Diagnosis not present

## 2023-03-09 DIAGNOSIS — M9901 Segmental and somatic dysfunction of cervical region: Secondary | ICD-10-CM | POA: Diagnosis not present

## 2023-03-24 DIAGNOSIS — L0390 Cellulitis, unspecified: Secondary | ICD-10-CM | POA: Diagnosis not present

## 2023-04-01 DIAGNOSIS — M9901 Segmental and somatic dysfunction of cervical region: Secondary | ICD-10-CM | POA: Diagnosis not present

## 2023-04-06 DIAGNOSIS — M9901 Segmental and somatic dysfunction of cervical region: Secondary | ICD-10-CM | POA: Diagnosis not present

## 2023-04-08 DIAGNOSIS — M9901 Segmental and somatic dysfunction of cervical region: Secondary | ICD-10-CM | POA: Diagnosis not present

## 2023-04-20 DIAGNOSIS — M9901 Segmental and somatic dysfunction of cervical region: Secondary | ICD-10-CM | POA: Diagnosis not present

## 2023-04-29 DIAGNOSIS — M9901 Segmental and somatic dysfunction of cervical region: Secondary | ICD-10-CM | POA: Diagnosis not present

## 2023-05-03 DIAGNOSIS — M9901 Segmental and somatic dysfunction of cervical region: Secondary | ICD-10-CM | POA: Diagnosis not present

## 2023-05-13 DIAGNOSIS — M9901 Segmental and somatic dysfunction of cervical region: Secondary | ICD-10-CM | POA: Diagnosis not present

## 2023-05-18 DIAGNOSIS — M9901 Segmental and somatic dysfunction of cervical region: Secondary | ICD-10-CM | POA: Diagnosis not present

## 2023-05-27 DIAGNOSIS — M9901 Segmental and somatic dysfunction of cervical region: Secondary | ICD-10-CM | POA: Diagnosis not present

## 2023-06-01 DIAGNOSIS — M9901 Segmental and somatic dysfunction of cervical region: Secondary | ICD-10-CM | POA: Diagnosis not present

## 2023-06-10 DIAGNOSIS — M9901 Segmental and somatic dysfunction of cervical region: Secondary | ICD-10-CM | POA: Diagnosis not present

## 2023-06-15 DIAGNOSIS — M9901 Segmental and somatic dysfunction of cervical region: Secondary | ICD-10-CM | POA: Diagnosis not present

## 2023-06-21 DIAGNOSIS — L728 Other follicular cysts of the skin and subcutaneous tissue: Secondary | ICD-10-CM | POA: Diagnosis not present

## 2023-06-21 DIAGNOSIS — L2089 Other atopic dermatitis: Secondary | ICD-10-CM | POA: Diagnosis not present

## 2023-06-24 DIAGNOSIS — M9901 Segmental and somatic dysfunction of cervical region: Secondary | ICD-10-CM | POA: Diagnosis not present

## 2023-06-29 DIAGNOSIS — M9901 Segmental and somatic dysfunction of cervical region: Secondary | ICD-10-CM | POA: Diagnosis not present

## 2023-07-07 DIAGNOSIS — M9901 Segmental and somatic dysfunction of cervical region: Secondary | ICD-10-CM | POA: Diagnosis not present

## 2023-07-09 DIAGNOSIS — R300 Dysuria: Secondary | ICD-10-CM | POA: Diagnosis not present

## 2023-07-21 DIAGNOSIS — M9901 Segmental and somatic dysfunction of cervical region: Secondary | ICD-10-CM | POA: Diagnosis not present

## 2023-07-27 DIAGNOSIS — M9901 Segmental and somatic dysfunction of cervical region: Secondary | ICD-10-CM | POA: Diagnosis not present

## 2023-08-18 DIAGNOSIS — E66812 Obesity, class 2: Secondary | ICD-10-CM | POA: Diagnosis not present

## 2023-08-18 DIAGNOSIS — Z23 Encounter for immunization: Secondary | ICD-10-CM | POA: Diagnosis not present

## 2023-08-18 DIAGNOSIS — Z13220 Encounter for screening for lipoid disorders: Secondary | ICD-10-CM | POA: Diagnosis not present

## 2023-08-18 DIAGNOSIS — Z0000 Encounter for general adult medical examination without abnormal findings: Secondary | ICD-10-CM | POA: Diagnosis not present

## 2023-08-24 ENCOUNTER — Other Ambulatory Visit: Payer: Self-pay | Admitting: Family Medicine

## 2023-08-24 DIAGNOSIS — Z1231 Encounter for screening mammogram for malignant neoplasm of breast: Secondary | ICD-10-CM

## 2023-08-30 DIAGNOSIS — M9901 Segmental and somatic dysfunction of cervical region: Secondary | ICD-10-CM | POA: Diagnosis not present

## 2023-09-02 DIAGNOSIS — M9901 Segmental and somatic dysfunction of cervical region: Secondary | ICD-10-CM | POA: Diagnosis not present

## 2023-09-09 DIAGNOSIS — M9901 Segmental and somatic dysfunction of cervical region: Secondary | ICD-10-CM | POA: Diagnosis not present

## 2023-09-28 ENCOUNTER — Ambulatory Visit
Admission: RE | Admit: 2023-09-28 | Discharge: 2023-09-28 | Disposition: A | Discharge status: Home / Self Care | Payer: BC Managed Care – PPO | Source: Ambulatory Visit | Attending: Family Medicine | Admitting: Family Medicine

## 2023-09-28 DIAGNOSIS — Z1231 Encounter for screening mammogram for malignant neoplasm of breast: Secondary | ICD-10-CM | POA: Diagnosis not present

## 2023-10-05 DIAGNOSIS — D120 Benign neoplasm of cecum: Secondary | ICD-10-CM | POA: Diagnosis not present

## 2023-10-05 DIAGNOSIS — Z1211 Encounter for screening for malignant neoplasm of colon: Secondary | ICD-10-CM | POA: Diagnosis not present

## 2023-10-05 DIAGNOSIS — K648 Other hemorrhoids: Secondary | ICD-10-CM | POA: Diagnosis not present

## 2023-10-12 DIAGNOSIS — E6609 Other obesity due to excess calories: Secondary | ICD-10-CM | POA: Diagnosis not present

## 2023-10-12 DIAGNOSIS — Z7689 Persons encountering health services in other specified circumstances: Secondary | ICD-10-CM | POA: Diagnosis not present

## 2023-10-12 DIAGNOSIS — Z6838 Body mass index (BMI) 38.0-38.9, adult: Secondary | ICD-10-CM | POA: Diagnosis not present

## 2023-10-19 DIAGNOSIS — M9901 Segmental and somatic dysfunction of cervical region: Secondary | ICD-10-CM | POA: Diagnosis not present

## 2023-11-02 DIAGNOSIS — M9901 Segmental and somatic dysfunction of cervical region: Secondary | ICD-10-CM | POA: Diagnosis not present

## 2023-11-16 DIAGNOSIS — M9901 Segmental and somatic dysfunction of cervical region: Secondary | ICD-10-CM | POA: Diagnosis not present

## 2023-11-25 DIAGNOSIS — M9901 Segmental and somatic dysfunction of cervical region: Secondary | ICD-10-CM | POA: Diagnosis not present

## 2023-12-14 DIAGNOSIS — M9901 Segmental and somatic dysfunction of cervical region: Secondary | ICD-10-CM | POA: Diagnosis not present

## 2023-12-14 DIAGNOSIS — E6609 Other obesity due to excess calories: Secondary | ICD-10-CM | POA: Diagnosis not present

## 2023-12-23 DIAGNOSIS — M9901 Segmental and somatic dysfunction of cervical region: Secondary | ICD-10-CM | POA: Diagnosis not present

## 2023-12-28 DIAGNOSIS — M9901 Segmental and somatic dysfunction of cervical region: Secondary | ICD-10-CM | POA: Diagnosis not present

## 2024-01-10 DIAGNOSIS — M9901 Segmental and somatic dysfunction of cervical region: Secondary | ICD-10-CM | POA: Diagnosis not present

## 2024-01-27 DIAGNOSIS — M9901 Segmental and somatic dysfunction of cervical region: Secondary | ICD-10-CM | POA: Diagnosis not present

## 2024-02-11 DIAGNOSIS — L821 Other seborrheic keratosis: Secondary | ICD-10-CM | POA: Diagnosis not present

## 2024-02-11 DIAGNOSIS — L814 Other melanin hyperpigmentation: Secondary | ICD-10-CM | POA: Diagnosis not present

## 2024-02-11 DIAGNOSIS — L858 Other specified epidermal thickening: Secondary | ICD-10-CM | POA: Diagnosis not present

## 2024-02-11 DIAGNOSIS — D235 Other benign neoplasm of skin of trunk: Secondary | ICD-10-CM | POA: Diagnosis not present

## 2024-02-17 DIAGNOSIS — M9901 Segmental and somatic dysfunction of cervical region: Secondary | ICD-10-CM | POA: Diagnosis not present

## 2024-02-22 DIAGNOSIS — M9901 Segmental and somatic dysfunction of cervical region: Secondary | ICD-10-CM | POA: Diagnosis not present

## 2024-03-02 DIAGNOSIS — M9901 Segmental and somatic dysfunction of cervical region: Secondary | ICD-10-CM | POA: Diagnosis not present

## 2024-03-06 DIAGNOSIS — M9901 Segmental and somatic dysfunction of cervical region: Secondary | ICD-10-CM | POA: Diagnosis not present

## 2024-03-15 DIAGNOSIS — M9901 Segmental and somatic dysfunction of cervical region: Secondary | ICD-10-CM | POA: Diagnosis not present

## 2024-03-30 DIAGNOSIS — M9901 Segmental and somatic dysfunction of cervical region: Secondary | ICD-10-CM | POA: Diagnosis not present

## 2024-03-31 DIAGNOSIS — Z6838 Body mass index (BMI) 38.0-38.9, adult: Secondary | ICD-10-CM | POA: Diagnosis not present

## 2024-04-03 DIAGNOSIS — M9901 Segmental and somatic dysfunction of cervical region: Secondary | ICD-10-CM | POA: Diagnosis not present

## 2024-04-13 DIAGNOSIS — M9901 Segmental and somatic dysfunction of cervical region: Secondary | ICD-10-CM | POA: Diagnosis not present

## 2024-04-18 DIAGNOSIS — M9901 Segmental and somatic dysfunction of cervical region: Secondary | ICD-10-CM | POA: Diagnosis not present

## 2024-04-27 DIAGNOSIS — M9901 Segmental and somatic dysfunction of cervical region: Secondary | ICD-10-CM | POA: Diagnosis not present

## 2024-05-02 DIAGNOSIS — M9901 Segmental and somatic dysfunction of cervical region: Secondary | ICD-10-CM | POA: Diagnosis not present

## 2024-05-12 DIAGNOSIS — E6609 Other obesity due to excess calories: Secondary | ICD-10-CM | POA: Diagnosis not present

## 2024-05-12 DIAGNOSIS — Z6838 Body mass index (BMI) 38.0-38.9, adult: Secondary | ICD-10-CM | POA: Diagnosis not present

## 2024-05-25 DIAGNOSIS — M9901 Segmental and somatic dysfunction of cervical region: Secondary | ICD-10-CM | POA: Diagnosis not present

## 2024-05-31 DIAGNOSIS — M9901 Segmental and somatic dysfunction of cervical region: Secondary | ICD-10-CM | POA: Diagnosis not present

## 2024-06-08 DIAGNOSIS — M9901 Segmental and somatic dysfunction of cervical region: Secondary | ICD-10-CM | POA: Diagnosis not present

## 2024-06-12 DIAGNOSIS — M9901 Segmental and somatic dysfunction of cervical region: Secondary | ICD-10-CM | POA: Diagnosis not present

## 2024-07-20 DIAGNOSIS — M9901 Segmental and somatic dysfunction of cervical region: Secondary | ICD-10-CM | POA: Diagnosis not present

## 2024-08-08 DIAGNOSIS — E785 Hyperlipidemia, unspecified: Secondary | ICD-10-CM | POA: Diagnosis not present

## 2024-08-08 DIAGNOSIS — Z6838 Body mass index (BMI) 38.0-38.9, adult: Secondary | ICD-10-CM | POA: Diagnosis not present

## 2024-08-08 DIAGNOSIS — E6609 Other obesity due to excess calories: Secondary | ICD-10-CM | POA: Diagnosis not present

## 2024-08-08 DIAGNOSIS — E669 Obesity, unspecified: Secondary | ICD-10-CM | POA: Diagnosis not present

## 2024-08-22 DIAGNOSIS — M9901 Segmental and somatic dysfunction of cervical region: Secondary | ICD-10-CM | POA: Diagnosis not present

## 2024-08-22 DIAGNOSIS — L814 Other melanin hyperpigmentation: Secondary | ICD-10-CM | POA: Diagnosis not present

## 2024-08-22 DIAGNOSIS — D235 Other benign neoplasm of skin of trunk: Secondary | ICD-10-CM | POA: Diagnosis not present

## 2024-08-25 DIAGNOSIS — E785 Hyperlipidemia, unspecified: Secondary | ICD-10-CM | POA: Diagnosis not present

## 2024-08-25 DIAGNOSIS — Z23 Encounter for immunization: Secondary | ICD-10-CM | POA: Diagnosis not present

## 2024-08-25 DIAGNOSIS — Z131 Encounter for screening for diabetes mellitus: Secondary | ICD-10-CM | POA: Diagnosis not present

## 2024-08-25 DIAGNOSIS — E66812 Obesity, class 2: Secondary | ICD-10-CM | POA: Diagnosis not present

## 2024-08-25 DIAGNOSIS — Z0000 Encounter for general adult medical examination without abnormal findings: Secondary | ICD-10-CM | POA: Diagnosis not present

## 2024-08-28 ENCOUNTER — Other Ambulatory Visit: Payer: Self-pay | Admitting: Physician Assistant

## 2024-08-28 DIAGNOSIS — Z1231 Encounter for screening mammogram for malignant neoplasm of breast: Secondary | ICD-10-CM

## 2024-08-31 DIAGNOSIS — M9901 Segmental and somatic dysfunction of cervical region: Secondary | ICD-10-CM | POA: Diagnosis not present

## 2024-09-28 ENCOUNTER — Ambulatory Visit
Admission: RE | Admit: 2024-09-28 | Discharge: 2024-09-28 | Disposition: A | Discharge status: Home / Self Care | Source: Ambulatory Visit | Attending: Physician Assistant | Admitting: Physician Assistant

## 2024-09-28 DIAGNOSIS — Z1231 Encounter for screening mammogram for malignant neoplasm of breast: Secondary | ICD-10-CM
